# Patient Record
Sex: Male | Born: 1965 | Race: White | Hispanic: No | Marital: Married | Smoking: Never smoker
Health system: Southern US, Community
[De-identification: ages and names within clinical notes are randomized; demographics above are authoritative.]

## PROBLEM LIST (undated history)

## (undated) DIAGNOSIS — Z87442 Personal history of urinary calculi: Secondary | ICD-10-CM

## (undated) DIAGNOSIS — I499 Cardiac arrhythmia, unspecified: Secondary | ICD-10-CM

## (undated) DIAGNOSIS — I4819 Other persistent atrial fibrillation: Secondary | ICD-10-CM

## (undated) DIAGNOSIS — K8020 Calculus of gallbladder without cholecystitis without obstruction: Secondary | ICD-10-CM

## (undated) DIAGNOSIS — G4730 Sleep apnea, unspecified: Secondary | ICD-10-CM

## (undated) HISTORY — DX: Other persistent atrial fibrillation: I48.19

## (undated) HISTORY — PX: CYST EXCISION: SHX5701

---

## 1999-12-10 ENCOUNTER — Ambulatory Visit (HOSPITAL_COMMUNITY): Admission: RE | Admit: 1999-12-10 | Discharge: 1999-12-10 | Payer: Self-pay | Admitting: *Deleted

## 1999-12-10 ENCOUNTER — Encounter: Payer: Self-pay | Admitting: *Deleted

## 2019-05-24 ENCOUNTER — Other Ambulatory Visit: Payer: Self-pay | Admitting: Urology

## 2019-06-05 ENCOUNTER — Other Ambulatory Visit (HOSPITAL_COMMUNITY)
Admission: RE | Admit: 2019-06-05 | Discharge: 2019-06-05 | Disposition: A | Discharge status: Home / Self Care | Payer: 59 | Source: Ambulatory Visit | Attending: Urology | Admitting: Urology

## 2019-06-05 DIAGNOSIS — Z01812 Encounter for preprocedural laboratory examination: Secondary | ICD-10-CM | POA: Diagnosis not present

## 2019-06-05 DIAGNOSIS — Z20822 Contact with and (suspected) exposure to covid-19: Secondary | ICD-10-CM | POA: Insufficient documentation

## 2019-06-05 LAB — SARS CORONAVIRUS 2 (TAT 6-24 HRS): SARS Coronavirus 2: NEGATIVE

## 2019-06-06 ENCOUNTER — Encounter (HOSPITAL_BASED_OUTPATIENT_CLINIC_OR_DEPARTMENT_OTHER): Payer: Self-pay | Admitting: Urology

## 2019-06-08 ENCOUNTER — Other Ambulatory Visit: Payer: Self-pay

## 2019-06-08 ENCOUNTER — Encounter (HOSPITAL_BASED_OUTPATIENT_CLINIC_OR_DEPARTMENT_OTHER): Payer: Self-pay | Admitting: Anesthesiology

## 2019-06-08 ENCOUNTER — Observation Stay (HOSPITAL_BASED_OUTPATIENT_CLINIC_OR_DEPARTMENT_OTHER): Payer: 59

## 2019-06-08 ENCOUNTER — Encounter (HOSPITAL_COMMUNITY)
Admission: RE | Disposition: A | Discharge status: Home / Self Care | Payer: Self-pay | Source: Home / Self Care | Attending: Internal Medicine

## 2019-06-08 ENCOUNTER — Encounter (HOSPITAL_BASED_OUTPATIENT_CLINIC_OR_DEPARTMENT_OTHER): Payer: Self-pay | Admitting: Urology

## 2019-06-08 ENCOUNTER — Ambulatory Visit (HOSPITAL_COMMUNITY): Payer: 59

## 2019-06-08 ENCOUNTER — Observation Stay (HOSPITAL_BASED_OUTPATIENT_CLINIC_OR_DEPARTMENT_OTHER)
Admission: RE | Admit: 2019-06-08 | Discharge: 2019-06-09 | Disposition: A | Discharge status: Home / Self Care | Payer: 59 | Attending: Internal Medicine | Admitting: Internal Medicine

## 2019-06-08 ENCOUNTER — Telehealth: Payer: Self-pay

## 2019-06-08 DIAGNOSIS — Z87442 Personal history of urinary calculi: Secondary | ICD-10-CM | POA: Insufficient documentation

## 2019-06-08 DIAGNOSIS — G4730 Sleep apnea, unspecified: Secondary | ICD-10-CM | POA: Insufficient documentation

## 2019-06-08 DIAGNOSIS — I4891 Unspecified atrial fibrillation: Secondary | ICD-10-CM | POA: Diagnosis not present

## 2019-06-08 DIAGNOSIS — N200 Calculus of kidney: Secondary | ICD-10-CM | POA: Insufficient documentation

## 2019-06-08 HISTORY — PX: EXTRACORPOREAL SHOCK WAVE LITHOTRIPSY: SHX1557

## 2019-06-08 LAB — COMPREHENSIVE METABOLIC PANEL
ALT: 19 U/L (ref 0–44)
AST: 17 U/L (ref 15–41)
Albumin: 4.3 g/dL (ref 3.5–5.0)
Alkaline Phosphatase: 51 U/L (ref 38–126)
Anion gap: 7 (ref 5–15)
BUN: 17 mg/dL (ref 6–20)
CO2: 26 mmol/L (ref 22–32)
Calcium: 8.9 mg/dL (ref 8.9–10.3)
Chloride: 107 mmol/L (ref 98–111)
Creatinine, Ser: 1.02 mg/dL (ref 0.61–1.24)
GFR calc Af Amer: >60 mL/min (ref >60)
GFR calc non Af Amer: >60 mL/min (ref >60)
Glucose, Bld: 104 mg/dL — ABNORMAL HIGH (ref 70–99)
Potassium: 4.6 mmol/L (ref 3.5–5.1)
Sodium: 140 mmol/L (ref 135–145)
Total Bilirubin: 0.9 mg/dL (ref 0.3–1.2)
Total Protein: 7.1 g/dL (ref 6.5–8.1)

## 2019-06-08 LAB — LIPID PANEL
Cholesterol: 171 mg/dL (ref 0–200)
HDL: 39 mg/dL — ABNORMAL LOW (ref >40)
LDL Cholesterol: 119 mg/dL — ABNORMAL HIGH (ref 0–99)
Total CHOL/HDL Ratio: 4.4 RATIO
Triglycerides: 64 mg/dL (ref <150)
VLDL: 13 mg/dL (ref 0–40)

## 2019-06-08 LAB — CBC
HCT: 44.5 % (ref 39.0–52.0)
Hemoglobin: 14.5 g/dL (ref 13.0–17.0)
MCH: 28.8 pg (ref 26.0–34.0)
MCHC: 32.6 g/dL (ref 30.0–36.0)
MCV: 88.5 fL (ref 80.0–100.0)
Platelets: 237 10*3/uL (ref 150–400)
RBC: 5.03 MIL/uL (ref 4.22–5.81)
RDW: 12.7 % (ref 11.5–15.5)
WBC: 7.5 10*3/uL (ref 4.0–10.5)
nRBC: 0 % (ref 0.0–0.2)

## 2019-06-08 LAB — ECHOCARDIOGRAM COMPLETE
Height: 74 in
Weight: 3212.8 oz

## 2019-06-08 LAB — HEMOGLOBIN A1C
Hgb A1c MFr Bld: 5.6 % (ref 4.8–5.6)
Mean Plasma Glucose: 114.02 mg/dL

## 2019-06-08 LAB — PHOSPHORUS: Phosphorus: 3.7 mg/dL (ref 2.5–4.6)

## 2019-06-08 LAB — HIV ANTIBODY (ROUTINE TESTING W REFLEX): HIV Screen 4th Generation wRfx: NONREACTIVE

## 2019-06-08 LAB — PROTIME-INR
INR: 0.9 (ref 0.8–1.2)
Prothrombin Time: 12.5 seconds (ref 11.4–15.2)

## 2019-06-08 LAB — MAGNESIUM: Magnesium: 2.3 mg/dL (ref 1.7–2.4)

## 2019-06-08 LAB — TSH: TSH: 2.499 u[IU]/mL (ref 0.350–4.500)

## 2019-06-08 SURGERY — LITHOTRIPSY, ESWL
Anesthesia: LOCAL | Laterality: Left

## 2019-06-08 MED ORDER — DIPHENHYDRAMINE HCL 25 MG PO CAPS
ORAL_CAPSULE | ORAL | Status: AC
  Filled 2019-06-08: qty 1

## 2019-06-08 MED ORDER — MAGNESIUM HYDROXIDE 400 MG/5ML PO SUSP
30.0000 mL | Freq: Every day | ORAL | Status: DC | PRN
  Filled 2019-06-08: qty 30

## 2019-06-08 MED ORDER — CIPROFLOXACIN HCL 500 MG PO TABS
ORAL_TABLET | ORAL | Status: AC
  Filled 2019-06-08: qty 1

## 2019-06-08 MED ORDER — HYDROCODONE-ACETAMINOPHEN 5-325 MG PO TABS: 1.0000 | ORAL_TABLET | Freq: Four times a day (QID) | ORAL | 0 refills | Status: DC | PRN

## 2019-06-08 MED ORDER — DIAZEPAM 5 MG PO TABS
ORAL_TABLET | ORAL | Status: AC
  Filled 2019-06-08: qty 2

## 2019-06-08 MED ORDER — SODIUM CHLORIDE 0.9% FLUSH
3.0000 mL | Freq: Two times a day (BID) | INTRAVENOUS | Status: DC
  Filled 2019-06-08: qty 3

## 2019-06-08 MED ORDER — TAMSULOSIN HCL 0.4 MG PO CAPS: 0.4000 mg | ORAL_CAPSULE | Freq: Every day | ORAL | 0 refills | Status: AC

## 2019-06-08 MED ORDER — ACETAMINOPHEN 650 MG RE SUPP
650.0000 mg | Freq: Four times a day (QID) | RECTAL | Status: DC | PRN
  Filled 2019-06-08: qty 1

## 2019-06-08 MED ORDER — ACETAMINOPHEN 325 MG PO TABS
650.0000 mg | ORAL_TABLET | Freq: Four times a day (QID) | ORAL | Status: DC | PRN
  Filled 2019-06-08: qty 2

## 2019-06-08 MED ORDER — FLEET ENEMA 7-19 GM/118ML RE ENEM
1.0000 | ENEMA | Freq: Once | RECTAL | Status: DC | PRN
  Filled 2019-06-08: qty 1

## 2019-06-08 MED ORDER — METOPROLOL TARTRATE 5 MG/5ML IV SOLN
2.5000 mg | Freq: Once | INTRAVENOUS | Status: AC
  Administered 2019-06-08: 2.5 mg via INTRAVENOUS
  Filled 2019-06-08: qty 5

## 2019-06-08 MED ORDER — DIAZEPAM 5 MG PO TABS
10.0000 mg | ORAL_TABLET | ORAL | Status: AC
  Administered 2019-06-08: 10 mg via ORAL
  Filled 2019-06-08: qty 2

## 2019-06-08 MED ORDER — SENNA 8.6 MG PO TABS
1.0000 | ORAL_TABLET | Freq: Two times a day (BID) | ORAL | Status: DC
  Administered 2019-06-09: 8.6 mg via ORAL
  Filled 2019-06-08 (×3): qty 1

## 2019-06-08 MED ORDER — DIPHENHYDRAMINE HCL 25 MG PO CAPS
25.0000 mg | ORAL_CAPSULE | ORAL | Status: AC
  Administered 2019-06-08: 25 mg via ORAL
  Filled 2019-06-08: qty 1

## 2019-06-08 MED ORDER — ONDANSETRON HCL 4 MG/2ML IJ SOLN
4.0000 mg | Freq: Four times a day (QID) | INTRAMUSCULAR | Status: DC | PRN
  Filled 2019-06-08: qty 2

## 2019-06-08 MED ORDER — CIPROFLOXACIN HCL 500 MG PO TABS
500.0000 mg | ORAL_TABLET | ORAL | Status: AC
  Administered 2019-06-08: 07:00:00 500 mg via ORAL
  Filled 2019-06-08: qty 1

## 2019-06-08 MED ORDER — SODIUM CHLORIDE 0.9 % IV SOLN
INTRAVENOUS | Status: DC
  Filled 2019-06-08: qty 1000

## 2019-06-08 MED ORDER — ONDANSETRON HCL 4 MG PO TABS
4.0000 mg | ORAL_TABLET | Freq: Four times a day (QID) | ORAL | Status: DC | PRN
  Filled 2019-06-08: qty 1

## 2019-06-08 MED ORDER — METOPROLOL TARTRATE 5 MG/5ML IV SOLN
INTRAVENOUS | Status: AC
  Filled 2019-06-08: qty 5

## 2019-06-08 MED ORDER — SORBITOL 70 % SOLN
30.0000 mL | Freq: Every day | Status: DC | PRN
  Filled 2019-06-08: qty 30

## 2019-06-08 NOTE — Progress Notes (Signed)
Patient was tachycardic during ESWL ranging from 90s-120s.  Post-operative ECG showed A fib with RVR.  Patient is asymptomatic.  Medicine consult was called immediately following the procedure for evaluation and recommendation for above arrhythmia.

## 2019-06-08 NOTE — Discharge Instructions (Signed)
See [facility] discharge instructions in chart.  

## 2019-06-08 NOTE — Progress Notes (Signed)
  Echocardiogram 2D Echocardiogram has been performed.  Stark Bray Swaim 06/08/2019, 11:59 AM

## 2019-06-08 NOTE — H&P (Signed)
[facility] History and Physical  [name] YSA:[id number] DOB: [birth year] DOA: 06/08/2019 Referring physician: Dr. Arita Miss, Urology PCP: Patient, No Pcp Per  Chief Complaint: intra procedure A fib with RVR Came from: Home ------------------------------------------------------------------------------------------------------ Assessment/Plan: Active Problems:   Atrial fibrillation with RVR (HCC)  A. fib with RVR New onset A. Fib -Start during ESWL -Asymptomatic, heart rate as high as 140s.  Blood pressure stable. -No documented past medical history but seems to have undiagnosed sleep apnea. -Seems to have converted with 2.5 mg metoprolol IV 1 dose. Obtain post conversion EKG. -Obtain echocardiogram. -Patient has not had a physical in the last several years. I will obtain routine blood work including CBC, CMP, TSH, magnesium, lipid panel, A1c to look for any more risk factors.  Possible underlying sleep apnea -During my evaluation, I noted that he was dozing off periodically.  It could very well be due to the effect of anesthesia.  But, on my further questioning, his wife stated that she thinks he has sleep apnea.  He snores during sleep.  Sometimes she notices him not breathing and has to wake him up to breathe. Patient is not certain if he feels sleepy during the day more than normal.  He is certain he has never fallen asleep behind the wheels. -Needs referral to sleep center discharge for outpatient overnight sleep study.  Nephro/neurolithiasis -Underwent left ESWL today by Dr. Arita Miss. -Continue to follow up with her. -Noted that he has been started on Flomax and Norco as needed.  DVT prophylaxis:  SCDs.  Planned for short stay. Antimicrobials:  None Fluid: Normal saline at 50 mL/h Diet: Cardiac diet  Code Status:  Full code Mobility: Encourage ambulation Family Communication:  Wife at bedside Discharge plan:  Hopefully home  tomorrow  Consultants:  Urology  ----------------------------------------------------------------------------------------------------- History of Present Illness: [name] is a 54 y.o. male with no documented past medical history who underwent left ESWL by urologist Dr. Arita Miss at [facility] today.  Per the urologist note, during the procedure, patient was tachycardic between 90s to 120s.  Postoperatively, ECG was obtained which showed A. fib with RVR.  Patient did not have any symptoms. Medical consultation was called.  I attended the patient immediately at the outpatient surgical center recovery unit. Patient is a very pleasant middle-aged Caucasian male sitting up in recliner.  Not in distress.  His wife was on his side.  Telemetry monitoring shows heart rate running between 90s to 130s.  Patient does not complain of any chest pain, chest tightness, shortness of breath, palpitation, dizziness at this time. Patient states it has been several years since he got his last physical.  He does not recall being told of any medical issues.  Does not take any medications at home.  He never smoked, drinks alcohol occasionally, never does binge drinking, last alcohol intake was more than week ago.  Never did drugs.  No history of hypo/hyperthyroidism. Does not recall having an episode of unprovoked palpitation, chest pain, dizziness. Used to be physically active before the pandemic.  Less active lately because of back issue and works from home.  Has not felt any limitation in his exercise capacity when he has to. During my evaluation, I noted that he was dozing off periodically.  It could very well be due to the effect of anesthesia.  But, on my further questioning, his wife stated that she thinks he has sleep apnea.  He snores during sleep.  Sometimes she notices him not  breathing and has to wake him up to breathe. Patient is not certain if he feels sleepy during the day more than normal.   He is certain he has never fallen asleep behind the wheels.  Last set of vitals at the time of my evaluation, temperature normal, heart rate between 90s to 130s, pressure 122/87, breathing comfortably, on low-flow oxygen for comfort postop. Does not need oxygen at home. EKG obtained post procedure today showed A. fib with RVR at the rate of 133 bpm, no ST-T wave changes, QTC 482 ms. At my presence, will give him metoprolol 2.5 mg IV 1 dose after which his heart rate improved to 70s.  Telemetry continues to show occasional PVCs.  Review of Systems:  All systems were reviewed and were negative unless otherwise mentioned in the HPI  Past medical history: History reviewed. No pertinent past medical history.  Past surgical history: History reviewed. No pertinent surgical history.  Social History:  reports that he has never smoked. He has never used smokeless tobacco. He reports current alcohol use. He reports that he does not use drugs.  Allergies:  No Known Allergies  Family history:  History reviewed. No pertinent family history.   Home Meds: Prior to Admission medications   Medication Sig Start Date End Date Taking? Authorizing Provider  HYDROcodone-acetaminophen (NORCO/VICODIN) 5-325 MG tablet Take 1 tablet by mouth every 6 (six) hours as needed for moderate pain (following kidney stone surgery). 06/08/19 06/07/20  [name], MD  tamsulosin (FLOMAX) 0.4 MG CAPS capsule Take 1 capsule (0.4 mg total) by mouth daily. 06/08/19 07/08/19  [name], MD    Physical Exam: Vitals:   06/08/19 0945 06/08/19 1000 06/08/19 1015 06/08/19 1030  BP: (!) 127/111 (!) 141/94 122/87 107/76  Pulse: (!) 105 74 74 96  Resp: 15 18 13  (!) 23  Temp:      TempSrc:      SpO2: 100% 100% 100% 100%  Weight:      Height:       Wt Readings from Last 3 Encounters:  06/08/19 91.1 kg   Body mass index is 25.78 kg/m.  General exam: Appears calm and comfortable.  Skin: No rashes, lesions or  ulcers. HEENT: Atraumatic, normocephalic, supple neck, no obvious bleeding Lungs: Clear to auscultation bilaterally CVS: Irregular rhythm, slightly tachycardic, no murmur GI/Abd soft, nontender, nondistended, bowel sound present CNS: Alert, awake, oriented x3 Psychiatry: Mood appropriate Extremities: No pedal edema, no calf tenderness  Labs on Admission:   CBC: No results for input(s): WBC, NEUTROABS, HGB, HCT, MCV, PLT in the last 168 hours.  Basic Metabolic Panel: No results for input(s): NA, K, CL, CO2, GLUCOSE, BUN, CREATININE, CALCIUM, MG, PHOS in the last 168 hours.  Liver Function Tests: No results for input(s): AST, ALT, ALKPHOS, BILITOT, PROT, ALBUMIN in the last 168 hours. No results for input(s): LIPASE, AMYLASE in the last 168 hours. No results for input(s): AMMONIA in the last 168 hours.  Cardiac Enzymes: No results for input(s): CKTOTAL, CKMB, CKMBINDEX, TROPONINI in the last 168 hours.  BNP (last 3 results) No results for input(s): BNP in the last 8760 hours.  ProBNP (last 3 results) No results for input(s): PROBNP in the last 8760 hours.  CBG: No results for input(s): GLUCAP in the last 168 hours.  Lipase  No results found for: LIPASE   Urinalysis No results found for: COLORURINE, APPEARANCEUR, LABSPEC, PHURINE, GLUCOSEU, HGBUR, BILIRUBINUR, KETONESUR, PROTEINUR, UROBILINOGEN, NITRITE, LEUKOCYTESUR   Drugs of Abuse  No  results found for: LABOPIA, COCAINSCRNUR, LABBENZ, AMPHETMU, THCU, LABBARB    Radiological Exams on Admission: No results found. ----------------------------------------------------------------------------------------------------------------------------------------------------------- Severity of Illness: The appropriate patient status for this patient is OBSERVATION. Observation status is judged to be reasonable and necessary in order to provide the required intensity of service to ensure the patient's safety. The patient's presenting  symptoms, physical exam findings, and initial radiographic and laboratory data in the context of their medical condition is felt to place them at decreased risk for further clinical deterioration. Furthermore, it is anticipated that the patient will be medically stable for discharge from the hospital within 2 midnights of admission. The following factors support the patient status of observation.   " The patient's presenting symptoms include no symptoms " The physical exam findings include A. fib with RVR. " The initial radiographic and laboratory data are A. fib with RVR.   Signed, Lorin Glass, MD [facility] 06/08/2019

## 2019-06-08 NOTE — Op Note (Signed)
See [facility] OP note scanned into chart. Also because of the size, density, location and other factors that cannot be anticipated I feel this will likely be a staged procedure. This fact supersedes any indication in the scanned [facility] operative note to the contrary.  

## 2019-06-08 NOTE — H&P (Signed)
CC: I have pain in the flank.  HPI: [name] is a 54 year-old male patient who was referred by Dr. Alanson Aly. Lenise Arena [facility]), MD who is here for flank pain.  The problem is on the right side. His pain started about 01/14/2019. The pain is dull. The intensity of his pain is rated as a 5. The pain is intermittent. The pain does radiate.   Noted right flank pain and dark urine Oct - Dec 2020. Pain down into LLQ. KUB 04/12/2019 with possible RLP stone (2-3 mm) and left proximal stone (6-76mm). He hasn't seen a stone pass. He has MH on UA with 3-10 rbc. AUASS = 8. Low back pain but sitting at home a lot. Drank more water. Non smoker and no exposures.   CT today with 3-4 mm RLP stones and an 8 mm LLP stone. Visible on scout.   Spermatocele in 2016 noted in [place]. He recalls passing a small stone years ago.     ALLERGIES: No Allergies    MEDICATIONS: Prednisolone Acetate 1 % suspension, drops  Vitamin D3  Zinc     GU PSH: Vasectomy - 2016, 2007       PSH Notes: Surgery Of Male Genitalia Vasectomy, Excision Of Lesion Legs   NON-GU PSH: None   GU PMH: Spermatocele of epididymis, Unspec, Spermatocele - 2016 History of urolithiasis, History of kidney stones - 2016    NON-GU PMH: Encounter for general adult medical examination without abnormal findings, Encounter for preventive health examination - 2016    FAMILY HISTORY: 2 daughters - Other 1 son - Other Bladder Cancer - Grandmother gallbladder disease - Runs In Family Kidney Stones - Runs In Family Patient's father is still living - Other patient's mother is still living - Other   SOCIAL HISTORY: Marital Status: Married Current Smoking Status: Patient has never smoked.   Tobacco Use Assessment Completed: Used Tobacco in last 30 days? Social Drinker.  Does not drink caffeine. Patient's occupation Product/process development scientist.     Notes: Never a smoker, Alcohol use, Occupation, Caffeine use, Married, Two children   REVIEW OF SYSTEMS:     GU Review Male:   Patient reports frequent urination, get up at night to urinate, and have to strain to urinate . Patient denies hard to postpone urination, burning/ pain with urination, leakage of urine, stream starts and stops, trouble starting your stream, erection problems, and penile pain.  Gastrointestinal (Upper):   Patient denies nausea, vomiting, and indigestion/ heartburn.  Gastrointestinal (Lower):   Patient denies diarrhea and constipation.  Constitutional:   Patient denies fever, night sweats, weight loss, and fatigue.  Skin:   Patient denies skin rash/ lesion and itching.  Eyes:   Patient denies blurred vision and double vision.  Ears/ Nose/ Throat:   Patient denies sore throat and sinus problems.  Hematologic/Lymphatic:   Patient denies swollen glands and easy bruising.  Cardiovascular:   Patient denies leg swelling and chest pains.  Respiratory:   Patient denies cough and shortness of breath.  Endocrine:   Patient denies excessive thirst.  Musculoskeletal:   Patient denies joint pain and back pain.  Neurological:   Patient denies headaches and dizziness.  Psychologic:   Patient denies depression and anxiety.   Notes: Blood in urine    VITAL SIGNS:      05/17/2019 08:39 AM  Weight 202 lb / 91.63 kg  Height 74 in / 187.96 cm  BP 139/94 mmHg  Pulse 65 /min  Temperature 97.8 F /  36.5 C  BMI 25.9 kg/m   GU PHYSICAL EXAMINATION:    Anus and Perineum: No hemorrhoids. No anal stenosis. No rectal fissure, no anal fissure. No edema, no dimple, no perineal tenderness, no anal tenderness.  Scrotum: No lesions. No edema. No cysts. No warts.  Epididymides: Right: no spermatocele, no masses, no cysts, no tenderness, no induration, no enlargement. Left: no spermatocele, no masses, no cysts, no tenderness, no induration, no enlargement.  Testes: No tenderness, no swelling, no enlargement left testes. No tenderness, no swelling, no enlargement right testes. Normal location left  testes. Normal location right testes. No mass, no cyst, no varicocele, no hydrocele left testes. No mass, no cyst, no varicocele, no hydrocele right testes.  Urethral Meatus: Normal size. No lesion, no wart, no discharge, no polyp. Normal location.  Penis: Circumcised, no warts, no cracks. No dorsal Peyronie's plaques, no left corporal Peyronie's plaques, no right corporal Peyronie's plaques, no scarring, no warts. No balanitis, no meatal stenosis.  Prostate: Prostate about 30 grams. Left lobe normal consistency, right lobe normal consistency. Symmetrical lobes. No prostate nodule. Left lobe no tenderness, right lobe no tenderness.   Seminal Vesicles: Nonpalpable.  Sphincter Tone: Normal sphincter. No rectal tenderness. No rectal mass.    MULTI-SYSTEM PHYSICAL EXAMINATION:    Constitutional: Well-nourished. No physical deformities. Normally developed. Good grooming.  Neck: Neck symmetrical, not swollen. Normal tracheal position.  Respiratory: No labored breathing, no use of accessory muscles.   Cardiovascular: Normal temperature, normal extremity pulses, no swelling, no varicosities.  Lymphatic: No enlargement of neck, axillae, groin.  Skin: No paleness, no jaundice, no cyanosis. No lesion, no ulcer, no rash.  Neurologic / Psychiatric: Oriented to time, oriented to place, oriented to person. No depression, no anxiety, no agitation.  Gastrointestinal: No mass, no tenderness, no rigidity, non obese abdomen.  Eyes: Normal conjunctivae. Normal eyelids.  Ears, Nose, Mouth, and Throat: Left ear no scars, no lesions, no masses. Right ear no scars, no lesions, no masses. Nose no scars, no lesions, no masses. Normal hearing. Normal lips.  Musculoskeletal: Normal gait and station of head and neck.     PAST DATA REVIEWED:  Source Of History:  Patient  X-Ray Review: KUB: Reviewed Films. 03/2019    PROCEDURES:         C.T. ABD-Pelv w/o - 16109      Patient confirmed No Neulasta OnPro Device.            Urinalysis w/Scope Dipstick Dipstick Cont'd Micro  Color: Yellow Bilirubin: Neg mg/dL WBC/hpf: 0 - 5/hpf  Appearance: Clear Ketones: Neg mg/dL RBC/hpf: 3 - 10/hpf  Specific Gravity: 1.025 Blood: 1+ ery/uL Bacteria: Rare (0-9/hpf)  pH: 5.5 Protein: Neg mg/dL Cystals: NS (Not Seen)  Glucose: Neg mg/dL Urobilinogen: 0.2 mg/dL Casts: NS (Not Seen)    Nitrites: Neg Trichomonas: Not Present    Leukocyte Esterase: Trace leu/uL Mucous: Not Present      Epithelial Cells: NS (Not Seen)      Yeast: NS (Not Seen)      Sperm: Not Present    ASSESSMENT:      ICD-10 Details  1 GU:   Flank Pain - U04.54 Acute, Uncomplicated  2   Gross hematuria - U98.1 Acute, Uncomplicated - Will need to recheck UA and consider cystoscopy.   3   Renal calculus - X91.4 Acute, Uncomplicated - I discussed with the patient the nature risks and benefits of continued stone surveillance, off label use of alpha blockers, shockwave lithotripsy or ureteroscopy. All  questions answered. I drew him a picture of the anatomy. Discussed a colleague would do LEFT ESWL on the LLP stone. Observe the right.

## 2019-06-08 NOTE — Progress Notes (Addendum)
Pt was seen on rounds earlier today while still in recovery at [facility]. He was well with rate in 70-90s. We discussed the afib and the ESWL treatment. We discussed the sleep apnea and some of the movement during the procedure which might have diminished efficacy. We will see how the KUB looks in a few weeks. Appreciate excellent care from Dr. Arita Miss and Dr. Pola Corn. I will follow.

## 2019-06-09 DIAGNOSIS — I4891 Unspecified atrial fibrillation: Secondary | ICD-10-CM | POA: Diagnosis not present

## 2019-06-09 LAB — BASIC METABOLIC PANEL
Anion gap: 8 (ref 5–15)
BUN: 19 mg/dL (ref 6–20)
CO2: 27 mmol/L (ref 22–32)
Calcium: 9.1 mg/dL (ref 8.9–10.3)
Chloride: 107 mmol/L (ref 98–111)
Creatinine, Ser: 1.14 mg/dL (ref 0.61–1.24)
GFR calc Af Amer: >60 mL/min (ref >60)
GFR calc non Af Amer: >60 mL/min (ref >60)
Glucose, Bld: 100 mg/dL — ABNORMAL HIGH (ref 70–99)
Potassium: 4.4 mmol/L (ref 3.5–5.1)
Sodium: 142 mmol/L (ref 135–145)

## 2019-06-09 LAB — CBC
HCT: 45.5 % (ref 39.0–52.0)
Hemoglobin: 14.6 g/dL (ref 13.0–17.0)
MCH: 28.6 pg (ref 26.0–34.0)
MCHC: 32.1 g/dL (ref 30.0–36.0)
MCV: 89.2 fL (ref 80.0–100.0)
Platelets: 217 10*3/uL (ref 150–400)
RBC: 5.1 MIL/uL (ref 4.22–5.81)
RDW: 12.8 % (ref 11.5–15.5)
WBC: 10.2 10*3/uL (ref 4.0–10.5)
nRBC: 0 % (ref 0.0–0.2)

## 2019-06-09 MED ORDER — METOPROLOL SUCCINATE ER 25 MG PO TB24
25.0000 mg | ORAL_TABLET | Freq: Every day | ORAL | Status: DC
  Administered 2019-06-09: 11:00:00 25 mg via ORAL
  Filled 2019-06-09: qty 1

## 2019-06-09 MED ORDER — METOPROLOL SUCCINATE ER 25 MG PO TB24: 12.5000 mg | ORAL_TABLET | Freq: Every day | ORAL | 0 refills | Status: DC

## 2019-06-09 NOTE — Progress Notes (Signed)
Urology Inpatient Progress Report  Atrial fibrillation with RVR (HCC) [I48.91]  Procedure(s): EXTRACORPOREAL SHOCK WAVE LITHOTRIPSY (ESWL)  1 Day Post-Op   Intv/Subj: Patient developed A fib with RVR during ESWL yesterday.  Medicine consult was called and patient admitted to [facility] for echo, tele, and labwork.  He feels well this AM.  No back pain or urinary issues.  He denies palpitations.   Active Problems:   Atrial fibrillation with RVR (HCC)  Current Facility-Administered Medications  Medication Dose Route Frequency Provider Last Rate Last Admin  . 0.9 %  sodium chloride infusion   Intravenous Continuous Lorin Glass, MD 50 mL/hr at 06/09/19 0549 New Bag at 06/09/19 0549  . acetaminophen (TYLENOL) tablet 650 mg  650 mg Oral Q6H PRN Dahal, Melina Schools, MD       Or  . acetaminophen (TYLENOL) suppository 650 mg  650 mg Rectal Q6H PRN Dahal, Binaya, MD      . magnesium hydroxide (MILK OF MAGNESIA) suspension 30 mL  30 mL Oral Daily PRN Dahal, Binaya, MD      . ondansetron (ZOFRAN) tablet 4 mg  4 mg Oral Q6H PRN Dahal, Binaya, MD       Or  . ondansetron (ZOFRAN) injection 4 mg  4 mg Intravenous Q6H PRN Dahal, Binaya, MD      . senna (SENOKOT) tablet 8.6 mg  1 tablet Oral BID Dahal, Binaya, MD      . sodium chloride flush (NS) 0.9 % injection 3 mL  3 mL Intravenous Q12H Dahal, Binaya, MD      . sodium phosphate (FLEET) 7-19 GM/118ML enema 1 enema  1 enema Rectal Once PRN Dahal, Binaya, MD      . sorbitol 70 % solution 30 mL  30 mL Oral Daily PRN Lorin Glass, MD         Objective: Vital: Vitals:   06/08/19 1230 06/08/19 1335 06/08/19 2047 06/09/19 0448  BP: 112/80 (!) 144/91 120/79 (!) 128/99  Pulse: 90 76 72 79  Resp: 18 16  17   Temp:  97.6 F (36.4 C) 98.6 F (37 C) 98.1 F (36.7 C)  TempSrc:  Oral Oral Oral  SpO2: 100% 99% 100% 100%  Weight:      Height:       I/Os: I/O last 3 completed shifts: In: 2291.8 [P.O.:480; I.V.:1811.8] Out: 600 [Urine:600]  Physical Exam:   General: Patient is in no apparent distress Lungs: Normal respiratory effort, chest expands symmetrically. GI: The abdomen is soft and nontender without mass. Ext: lower extremities symmetric  Lab Results: Recent Labs    06/08/19 1113 06/09/19 0407  WBC 7.5 10.2  HGB 14.5 14.6  HCT 44.5 45.5   Recent Labs    06/08/19 1113 06/09/19 0407  NA 140 142  K 4.6 4.4  CL 107 107  CO2 26 27  GLUCOSE 104* 100*  BUN 17 19  CREATININE 1.02 1.14  CALCIUM 8.9 9.1   Recent Labs    06/08/19 1113  INR 0.9   No results for input(s): LABURIN in the last 72 hours. Results for orders placed or performed during the hospital encounter of 06/05/19  SARS CORONAVIRUS 2 (TAT 6-24 HRS) Nasopharyngeal Nasopharyngeal Swab     Status: None   Collection Time: 06/05/19  8:59 AM   Specimen: Nasopharyngeal Swab  Result Value Ref Range Status   SARS Coronavirus 2 NEGATIVE NEGATIVE Final    Comment: (NOTE) SARS-CoV-2 target nucleic acids are NOT DETECTED. The SARS-CoV-2 RNA is generally detectable in upper  and lower respiratory specimens during the acute phase of infection. Negative results do not preclude SARS-CoV-2 infection, do not rule out co-infections with other pathogens, and should not be used as the sole basis for treatment or other patient management decisions. Negative results must be combined with clinical observations, patient history, and epidemiological information. The expected result is Negative. Fact Sheet for Patients: HairSlick.no Fact Sheet for Healthcare Providers: quierodirigir.com This test is not yet approved or cleared by the [place] FDA and  has been authorized for detection and/or diagnosis of SARS-CoV-2 by FDA under an Emergency Use Authorization (EUA). This EUA will remain  in effect (meaning this test can be used) for the duration of the COVID-19 declaration under Section 56 4(b)(1) of the Act, 21  U.S.C. section 360bbb-3(b)(1), unless the authorization is terminated or revoked sooner. Performed at [facility], [address], [place], [place] [address]     Studies/Results: DG Abd 1 View  Result Date: 06/08/2019 CLINICAL DATA:  Preop lithotripsy. EXAM: ABDOMEN - 1 VIEW COMPARISON:  05/17/2019 FINDINGS: The bowel gas pattern is normal. Bilateral kidney stones are again identified. These are unchanged in appearance when compared with the CT from 05/17/2019. The largest stone is in the inferior pole of the left kidney measuring 8 mm. IMPRESSION: Bilateral renal calculi. Electronically Signed   By: Signa Kell M.D.   On: 06/08/2019 11:05   ECHOCARDIOGRAM COMPLETE  Result Date: 06/08/2019    ECHOCARDIOGRAM REPORT   Patient Name:   David TEBBETTS Date of Exam: 06/08/2019 Medical Rec #:  [id number]       Height:       74.0 in Accession #:    [id number]      Weight:       200.8 lb Date of Birth:  [birth year]       BSA:          2.177 m Patient Age:    54 years        BP:           107/76 mmHg Patient Gender: M               HR:           78 bpm. Exam Location:  Inpatient Procedure: 2D Echo, Cardiac Doppler and Color Doppler Indications:    Atrial Fibrillation 427.31  History:        Patient has no prior history of Echocardiogram examinations.                 Arrythmias:Atrial Fibrillation; Risk Factors:Non-Smoker.  Sonographer:    Renella Cunas RDCS Referring Phys: [id number] [facility] IMPRESSIONS  1. Left ventricular ejection fraction, by estimation, is 60 to 65%. The left ventricle has normal function. The left ventricle has no regional wall motion abnormalities. LV diastolic filing cannot be assessed due to underlying atrial fibrillation.  2. Right ventricular systolic function is normal. The right ventricular size is normal. Tricuspid regurgitation signal is inadequate for assessing PA pressure.  3. The mitral valve is normal in structure and function. No evidence of mitral valve  regurgitation. No evidence of mitral stenosis.  4. The aortic valve is tricuspid. Aortic valve regurgitation is not visualized. Mild aortic valve sclerosis is present, with no evidence of aortic valve stenosis.  5. The inferior vena cava is dilated in size with >50% respiratory variability, suggesting right atrial pressure of 8 mmHg. FINDINGS  Left Ventricle: Left ventricular ejection fraction, by estimation, is 60 to 65%.  The left ventricle has normal function. The left ventricle has no regional wall motion abnormalities. The left ventricular internal cavity size was normal in size. There is  no left ventricular hypertrophy. LV diastolic filing cannot be assessed due to underlying atrial fibrillation. Right Ventricle: The right ventricular size is normal. No increase in right ventricular wall thickness. Right ventricular systolic function is normal. Tricuspid regurgitation signal is inadequate for assessing PA pressure. Left Atrium: Left atrial size was normal in size. Right Atrium: Right atrial size was normal in size. Pericardium: There is no evidence of pericardial effusion. Mitral Valve: The mitral valve is normal in structure and function. Normal mobility of the mitral valve leaflets. No evidence of mitral valve regurgitation. No evidence of mitral valve stenosis. Tricuspid Valve: The tricuspid valve is normal in structure. Tricuspid valve regurgitation is trivial. No evidence of tricuspid stenosis. Aortic Valve: The aortic valve is tricuspid. Aortic valve regurgitation is not visualized. Mild aortic valve sclerosis is present, with no evidence of aortic valve stenosis. Pulmonic Valve: The pulmonic valve was normal in structure. Pulmonic valve regurgitation is not visualized. No evidence of pulmonic stenosis. Aorta: The aortic root is normal in size and structure. Venous: The inferior vena cava is dilated in size with greater than 50% respiratory variability, suggesting right atrial pressure of 8 mmHg.  IAS/Shunts: The interatrial septum appears to be lipomatous. No atrial level shunt detected by color flow Doppler.  LEFT VENTRICLE PLAX 2D LVIDd:         4.90 cm LVIDs:         4.00 cm LV PW:         0.80 cm LV IVS:        0.80 cm LVOT diam:     2.20 cm LV SV:         55 LV SV Index:   25 LVOT Area:     3.80 cm  RIGHT VENTRICLE TAPSE (M-mode): 1.8 cm LEFT ATRIUM             Index       RIGHT ATRIUM           Index LA diam:        3.50 cm 1.61 cm/m  RA Area:     16.10 cm LA Vol (A2C):   51.7 ml 23.75 ml/m RA Volume:   40.10 ml  18.42 ml/m LA Vol (A4C):   33.1 ml 15.20 ml/m LA Biplane Vol: 43.5 ml 19.98 ml/m  AORTIC VALVE LVOT Vmax:   80.70 cm/s LVOT Vmean:  57.500 cm/s LVOT VTI:    0.145 m  AORTA Ao Root diam: 3.50 cm  SHUNTS Systemic VTI:  0.14 m Systemic Diam: 2.20 cm Fransico Him MD Electronically signed by Fransico Him MD Signature Date/Time: 06/08/2019/12:18:46 PM    Final     Assessment: Procedure(s): EXTRACORPOREAL SHOCK WAVE LITHOTRIPSY (ESWL), 1 Day Post-Op  doing well.  Plan: -pt to continue to drink fluids and PRN pain control for ESWL -appreciate care from internal medicine regarding A fib   Jacalyn Lefevre, MD Urology 06/09/2019, 7:58 AM

## 2019-06-09 NOTE — Discharge Summary (Signed)
Physician Discharge Summary  [name] XBM:[id number] DOB: [birth year] DOA: 06/08/2019  PCP: Patient, No Pcp Per  Admit date: 06/08/2019 Discharge date: 06/09/2019  Admitted From: Home Discharge disposition: Home   Code Status: Full Code  Diet Recommendation: Heart healthy diet   Recommendations for Outpatient Follow-Up:   1. Follow-up with A. fib clinic as an outpatient 2. Follow-up with sleep apnea clinic as an outpatient 3. Follow-up with urology as outpatient  Discharge Diagnosis:   Active Problems:   Atrial fibrillation with RVR (HCC)  History of Present Illness / Brief narrative:  [name] is a 54 y.o. male with no documented past medical history who underwent left ESWL by urologist Dr. Arita Miss at [facility] on 2/25.  Per the urologist note, during the procedure, patient was tachycardic between 90s to 120s.  Postoperatively, ECG was obtained which showed A. fib with RVR.  Patient did not have any symptoms. Medical consultation was called.  I attended the patient immediately at the outpatient surgical center recovery unit.  Patient is a very pleasant middle-aged Caucasian male sitting up in recliner.  Not in distress.  His wife was on his side.  Telemetry monitoring showed heart rate running between 90s to 130s.  Patient had no complaint of any chest pain, chest tightness, shortness of breath, palpitation, dizziness at that time. Patient states it has been several years since he got his last physical.  He does not recall being told of any medical issues. Does not take any medications at home.  He never smoked, drinks alcohol occasionally, never does binge drinking, last alcohol intake was more than week ago.  Never did drugs.  No history of hypo/hyperthyroidism. Does not recall having an episode of unprovoked palpitation, chest pain, dizziness. Used to be physically active before the pandemic.  Less active lately because of back issue and works from home.   Has not felt any limitation in his exercise capacity when he has to.  During my evaluation, I noted that he was dozing off periodically.  It could very well be due to the effect of anesthesia.  But, on my further questioning, his wife stated that she thinks he has sleep apnea.  He snores during sleep.  Sometimes she notices him not breathing and has to wake him up to breathe. Patient is not certain if he feels sleepy during the day more than normal.  He is certain he has never fallen asleep behind the wheels.  Vitals at the time of my evaluation, temperature normal, heart rate between 90s to 130s, pressure 122/87, breathing comfortably, on low-flow oxygen for comfort postop. Does not need oxygen at home. EKG obtained post procedure today showed A. fib with RVR at the rate of 133 bpm, no ST-T wave changes, QTC 482 ms. At my presence, I gave him metoprolol 2.5 mg IV 1 dose after which his heart rate improved to 70s and sustained but rhythm remained in A. Fib. At that time, decision was made to keep him in the hospital overnight for observation.  Hospital Course:  A. fib with RVR New onset A. Fib -Patient was noted to be in RVR during the procedure of ESWL.  His heart rate went as high as 150s with no symptoms and stable blood pressure. -1 dose of metoprolol IV 2.5 mg helped heart rate improve to 70s but rhythm remained in A. Fib.  Because his heart rate also dipped down to 50s yesterday, I did not put him on any  scheduled AV nodal agent. -He does not have any documented past medical history and is not on any medicines at home.   -Based on history, patient most likely has obstructive sleep apnea which could be the driver of A. fib.   -In last 24 hours of telemetry monitoring, it has been noted that patient's heart rate remained mostly stable in the 70s but at times up to 123 and down to 40s.  Last night, he also had pause up to 1.5 seconds, again asymptomatic, probably related to sleep apnea  episodes. -Echocardiogram with EF 60 to 65%, no structural abnormality. -Labs with A1c 5.6, TSH normal, LDL at the elevated to 119 -I discussed the case with cardiologist on-call Dr. Rennis Golden for curbside consult.  He recommended to start on low-dose beta-blocker and discharged him to follow-up at A. fib clinic. -I have started him on metoprolol succinate 12.5 mg daily this morning.  Referral put in for A. fib clinic appointment.  Possible underlying sleep apnea -During my evaluation, I noted that he was dozing off periodically.  It could very well be due to the effect of anesthesia.  But, on my further questioning, his wife stated that she thinks he has sleep apnea.  He snores during sleep.  Sometimes she notices him not breathing and has to wake him up to breathe. Patient is not certain if he feels sleepy during the day more than normal.  He is certain he has never fallen asleep behind the wheels. -Referral given to sleep center discharge for outpatient overnight sleep study.  Nephro/neurolithiasis -Underwent left ESWL 2/25 by Dr. Arita Miss. -Continue to follow up with her. -Noted that he has been started on Flomax and Norco as needed.  Stable for discharge to home today.   Subjective:  Seen and examined this morning.  Pleasant middle-aged Caucasian male.  Not in distress.  No symptoms.  Has not essentially felt any different than yesterday.  Discharge Exam:   Vitals:   06/08/19 1335 06/08/19 2047 06/09/19 0448 06/09/19 1054  BP: (!) 144/91 120/79 (!) 128/99 122/79  Pulse: 76 72 79 (!) 117  Resp: 16  17   Temp: 97.6 F (36.4 C) 98.6 F (37 C) 98.1 F (36.7 C)   TempSrc: Oral Oral Oral   SpO2: 99% 100% 100%   Weight:      Height:        Body mass index is 25.78 kg/m.  General exam: Appears calm and comfortable.  Skin: No rashes, lesions or ulcers. HEENT: Atraumatic, normocephalic, supple neck, no obvious bleeding Lungs: Clear to auscultation bilaterally CVS: Controlled rate, A.  fib, no murmur GI/Abd soft, nontender, nondistended bowel sound present CNS: Alert, awake, oriented x3 Psychiatry: Mood appropriate Extremities: No pedal edema, no calf tenderness  Discharge Instructions:  Wound care: None Discharge Instructions    Amb Referral to AFIB Clinic   Complete by: As directed    Ambulatory referral to Sleep Studies   Complete by: As directed    Diet - low sodium heart healthy   Complete by: As directed    Diet - low sodium heart healthy   Complete by: As directed    Electrocardiogram report   Complete by: As directed    Increase activity slowly   Complete by: As directed    Increase activity slowly   Complete by: As directed      Follow-up Information    [facility] On 06/22/2019.   Why: 8:00am Contact information: [address] [place] [address] [phone]       [facility] Follow up.   Specialty: Sleep Medicine Contact information: [address] [place] [id number] mc [place] [address] [phone]       [facility] Follow up.   Specialty: Cardiology Contact information: [address] [id number] [address] [place] [address] [phone]         Allergies as of 06/09/2019   No Known Allergies     Medication List    TAKE these medications   HYDROcodone-acetaminophen 5-325 MG tablet Commonly known as: NORCO/VICODIN Take 1 tablet by mouth every 6 (six) hours as needed for moderate pain (following kidney stone surgery).   metoprolol succinate 25 MG 24 hr tablet Commonly known as: TOPROL-XL Take 0.5 tablets (12.5 mg total) by mouth daily. Start taking on: June 10, 2019   tamsulosin 0.4 MG Caps capsule Commonly known as: FLOMAX Take 1 capsule (0.4 mg total) by mouth daily.       Time coordinating discharge: 35 minutes  The results of significant diagnostics from this hospitalization (including  imaging, microbiology, ancillary and laboratory) are listed below for reference.    Procedures and Diagnostic Studies:   No results found.   Labs:   Basic Metabolic Panel: Recent Labs  Lab 06/08/19 1113 06/09/19 0407  NA 140 142  K 4.6 4.4  CL 107 107  CO2 26 27  GLUCOSE 104* 100*  BUN 17 19  CREATININE 1.02 1.14  CALCIUM 8.9 9.1  MG 2.3  --   PHOS 3.7  --    GFR Estimated Creatinine Clearance: 88.1 mL/min (by C-G formula based on SCr of 1.14 mg/dL). Liver Function Tests: Recent Labs  Lab 06/08/19 1113  AST 17  ALT 19  ALKPHOS 51  BILITOT 0.9  PROT 7.1  ALBUMIN 4.3   No results for input(s): LIPASE, AMYLASE in the last 168 hours. No results for input(s): AMMONIA in the last 168 hours. Coagulation profile Recent Labs  Lab 06/08/19 1113  INR 0.9    CBC: Recent Labs  Lab 06/08/19 1113 06/09/19 0407  WBC 7.5 10.2  HGB 14.5 14.6  HCT 44.5 45.5  MCV 88.5 89.2  PLT 237 217   Cardiac Enzymes: No results for input(s): CKTOTAL, CKMB, CKMBINDEX, TROPONINI in the last 168 hours. BNP: Invalid input(s): POCBNP CBG: No results for input(s): GLUCAP in the last 168 hours. D-Dimer No results for input(s): DDIMER in the last 72 hours. Hgb A1c Recent Labs    06/08/19 1113  HGBA1C 5.6   Lipid Profile Recent Labs    06/08/19 1113  CHOL 171  HDL 39*  LDLCALC 119*  TRIG 64  CHOLHDL 4.4   Thyroid function studies Recent Labs    06/08/19 1113  TSH 2.499   Anemia work up No results for input(s): VITAMINB12, FOLATE, FERRITIN, TIBC, IRON, RETICCTPCT in the last 72 hours. Microbiology Recent Results (from the past 240 hour(s))  SARS CORONAVIRUS 2 (TAT 6-24 HRS) Nasopharyngeal Nasopharyngeal Swab     Status: None   Collection Time: 06/05/19  8:59 AM   Specimen: Nasopharyngeal Swab  Result Value Ref Range Status   SARS Coronavirus 2 NEGATIVE NEGATIVE Final    Comment: (NOTE) SARS-CoV-2 target nucleic acids are NOT DETECTED. The SARS-CoV-2 RNA is  generally detectable in upper and lower respiratory specimens during the acute phase of infection. Negative results do not preclude SARS-CoV-2 infection, do not rule out co-infections with other pathogens, and  should not be used as the sole basis for treatment or other patient management decisions. Negative results must be combined with clinical observations, patient history, and epidemiological information. The expected result is Negative. Fact Sheet for Patients: SugarRoll.be Fact Sheet for Healthcare Providers: https://www.woods-mathews.com/ This test is not yet approved or cleared by the [place] FDA and  has been authorized for detection and/or diagnosis of SARS-CoV-2 by FDA under an Emergency Use Authorization (EUA). This EUA will remain  in effect (meaning this test can be used) for the duration of the COVID-19 declaration under Section 56 4(b)(1) of the Act, 21 U.S.C. section 360bbb-3(b)(1), unless the authorization is terminated or revoked sooner. Performed at [facility], [place] [address], [place], [place] [address]     Please note: You were cared for by a hospitalist during your hospital stay. Once you are discharged, your primary care physician will handle any further medical issues. Please note that NO REFILLS for any discharge medications will be authorized once you are discharged, as it is imperative that you return to your primary care physician (or establish a relationship with a primary care physician if you do not have one) for your post hospital discharge needs so that they can reassess your need for medications and monitor your lab values.  Signed: Terrilee Croak  [facility] 06/09/2019, 11:13 AM

## 2019-06-13 ENCOUNTER — Ambulatory Visit (HOSPITAL_COMMUNITY)
Admission: RE | Admit: 2019-06-13 | Discharge: 2019-06-13 | Disposition: A | Discharge status: Home / Self Care | Payer: 59 | Source: Ambulatory Visit | Attending: Physician Assistant | Admitting: Physician Assistant

## 2019-06-13 ENCOUNTER — Other Ambulatory Visit: Payer: Self-pay

## 2019-06-13 ENCOUNTER — Encounter (HOSPITAL_COMMUNITY): Payer: Self-pay | Admitting: Physician Assistant

## 2019-06-13 VITALS — BP 142/82 | HR 86 | Ht 74.0 in | Wt 201.8 lb

## 2019-06-13 DIAGNOSIS — Z87442 Personal history of urinary calculi: Secondary | ICD-10-CM | POA: Insufficient documentation

## 2019-06-13 DIAGNOSIS — I4819 Other persistent atrial fibrillation: Secondary | ICD-10-CM | POA: Insufficient documentation

## 2019-06-13 DIAGNOSIS — Z79899 Other long term (current) drug therapy: Secondary | ICD-10-CM | POA: Insufficient documentation

## 2019-06-13 DIAGNOSIS — R0683 Snoring: Secondary | ICD-10-CM | POA: Diagnosis not present

## 2019-06-13 DIAGNOSIS — I4891 Unspecified atrial fibrillation: Secondary | ICD-10-CM

## 2019-06-13 MED ORDER — RIVAROXABAN 20 MG PO TABS: 20.0000 mg | ORAL_TABLET | Freq: Every day | ORAL | 3 refills | Status: DC

## 2019-06-13 NOTE — Patient Instructions (Signed)
Start Xarelto 20mg  once a day WITH SUPPER  Scheduling will be in touch regarding sleep study

## 2019-06-13 NOTE — Progress Notes (Signed)
Primary Care Physician: Patient, No Pcp Per Primary Cardiologist: none Primary Electrophysiologist: none Referring Physician: Dr Collins Scotland is a 54 y.o. male with a history of nephrolithiasis and new onset atrial fibrillation who presents for consultation in the [facility].  The patient was initially diagnosed with atrial fibrillation 06/08/19 after presenting for a ESWL for kidney stones. Afib with RVR noted incidentally and he was asymptomatic. Patient is not on anticoagulation with a CHADS2VASC score of 0. He was started on metoprolol and discharged in rate controlled afib. Patient reports that he may feel a "flutter" occasionally but he denies any other symptoms. He does admit to severe snoring. He denies significant alcohol use.   Today, he denies symptoms of chest pain, shortness of breath, orthopnea, PND, lower extremity edema, dizziness, presyncope, syncope, snoring, daytime somnolence, bleeding, or neurologic sequela. The patient is tolerating medications without difficulties and is otherwise without complaint today.    Atrial Fibrillation Risk Factors:  he does have symptoms or diagnosis of sleep apnea. Sleep study pending. he does not have a history of rheumatic fever. he does not have a history of alcohol use. The patient does not have a history of early familial atrial fibrillation or other arrhythmias.  he has a BMI of Body mass index is 25.91 kg/m.Marland Kitchen Filed Weights   06/13/19 0841  Weight: 91.5 kg    No family history on file.   Atrial Fibrillation Management history:  Previous antiarrhythmic drugs: none Previous cardioversions: none Previous ablations: none CHADS2VASC score: 0 Anticoagulation history: none   No past medical history on file. Past Surgical History:  Procedure Laterality Date  . EXTRACORPOREAL SHOCK WAVE LITHOTRIPSY Left 06/08/2019   Procedure: EXTRACORPOREAL SHOCK WAVE LITHOTRIPSY (ESWL);  Surgeon:  [name], MD;  Location: [facility];  Service: Urology;  Laterality: Left;    Current Outpatient Medications  Medication Sig Dispense Refill  . HYDROcodone-acetaminophen (NORCO/VICODIN) 5-325 MG tablet Take 1 tablet by mouth every 6 (six) hours as needed for moderate pain (following kidney stone surgery). 20 tablet 0  . metoprolol succinate (TOPROL-XL) 25 MG 24 hr tablet Take 0.5 tablets (12.5 mg total) by mouth daily. 15 tablet 0  . tamsulosin (FLOMAX) 0.4 MG CAPS capsule Take 1 capsule (0.4 mg total) by mouth daily. 30 capsule 0  . rivaroxaban (XARELTO) 20 MG TABS tablet Take 1 tablet (20 mg total) by mouth daily with supper. 30 tablet 3   No current facility-administered medications for this encounter.    No Known Allergies  Social History   Socioeconomic History  . Marital status: Married    Spouse name: Not on file  . Number of children: Not on file  . Years of education: Not on file  . Highest education level: Not on file  Occupational History  . Not on file  Tobacco Use  . Smoking status: Never Smoker  . Smokeless tobacco: Never Used  Substance and Sexual Activity  . Alcohol use: Yes    Alcohol/week: 2.0 standard drinks    Types: 2 Cans of beer per week    Comment: occasional/social  . Drug use: Never  . Sexual activity: Not on file  Other Topics Concern  . Not on file  Social History Narrative  . Not on file   Social Determinants of Health   Financial Resource Strain:   . Difficulty of Paying Living Expenses: Not on file  Food Insecurity:   . Worried About Estate manager/land agent  of Food in the Last Year: Not on file  . Ran Out of Food in the Last Year: Not on file  Transportation Needs:   . Lack of Transportation (Medical): Not on file  . Lack of Transportation (Non-Medical): Not on file  Physical Activity:   . Days of Exercise per Week: Not on file  . Minutes of Exercise per Session: Not on file  Stress:   . Feeling of Stress : Not on file    Social Connections:   . Frequency of Communication with Friends and Family: Not on file  . Frequency of Social Gatherings with Friends and Family: Not on file  . Attends Religious Services: Not on file  . Active Member of Clubs or Organizations: Not on file  . Attends Banker Meetings: Not on file  . Marital Status: Not on file  Intimate Partner Violence:   . Fear of Current or Ex-Partner: Not on file  . Emotionally Abused: Not on file  . Physically Abused: Not on file  . Sexually Abused: Not on file     ROS- All systems are reviewed and negative except as per the HPI above.  Physical Exam: Vitals:   06/13/19 0841  BP: (!) 142/82  Pulse: 86  Weight: 91.5 kg  Height: 6\' 2"  (1.88 m)    GEN- The patient is well appearing, alert and oriented x 3 today.   Head- normocephalic, atraumatic Eyes-  Sclera clear, conjunctiva pink Ears- hearing intact Oropharynx- clear Neck- supple  Lungs- Clear to ausculation bilaterally, normal work of breathing Heart- irregular rate and rhythm, no murmurs, rubs or gallops  GI- soft, NT, ND, + BS Extremities- no clubbing, cyanosis, or edema MS- no significant deformity or atrophy Skin- no rash or lesion Psych- euthymic mood, full affect Neuro- strength and sensation are intact  Wt Readings from Last 3 Encounters:  06/13/19 91.5 kg  06/08/19 91.1 kg    EKG today demonstrates afib HR 86, QRS 98, QTc 440  Echo 06/08/19 demonstrated  1. Left ventricular ejection fraction, by estimation, is 60 to 65%. The  left ventricle has normal function. The left ventricle has no regional  wall motion abnormalities. LV diastolic filing cannot be assessed due to  underlying atrial fibrillation.  2. Right ventricular systolic function is normal. The right ventricular  size is normal. Tricuspid regurgitation signal is inadequate for assessing  PA pressure.  3. The mitral valve is normal in structure and function. No evidence of  mitral valve  regurgitation. No evidence of mitral stenosis.  4. The aortic valve is tricuspid. Aortic valve regurgitation is not  visualized. Mild aortic valve sclerosis is present, with no evidence of  aortic valve stenosis.  5. The inferior vena cava is dilated in size with >50% respiratory  variability, suggesting right atrial pressure of 8 mmHg.   Epic records are reviewed at length today  CHA2DS2-VASc Score = 0 The patient's score is based upon: CHF History: No HTN History: No Age : < 65 Diabetes History: No Stroke History: No Vascular Disease History: No Gender: Male      ASSESSMENT AND PLAN: 1. Persistent Atrial Fibrillation (ICD10:  I48.19) The patient's CHA2DS2-VASc score is 0, indicating a 0.2% annual risk of stroke.   General education about afib provided and questions answered. We also discussed his stroke risk and the risks and benefits of anticoagulation. Patient remains in rate controlled afib. Will plan for DCCV after 3 weeks of uninterrupted anticoagulation. Start Xarelto 20 mg daily. Anticipate  this will be short term with his low CHADS2VASC score. Continue Toprol 12.5 mg daily.  2. Suspected Obstructive sleep apnea/severe snoring The importance of adequate treatment of sleep apnea was discussed today in order to improve our ability to maintain sinus rhythm long term. Referral for sleep study pending per hospital discharge.   Follow up in the AF clinic in 2 weeks.   [name] PA-C [facility] [address] [place], [place] [address] [phone] 06/13/2019 9:32 AM

## 2019-06-14 ENCOUNTER — Telehealth: Payer: Self-pay | Admitting: *Deleted

## 2019-06-14 NOTE — Telephone Encounter (Signed)
PA for in lab sleep study submitted to [employer] via web portal. 

## 2019-06-19 ENCOUNTER — Telehealth (HOSPITAL_COMMUNITY): Payer: Self-pay | Admitting: *Deleted

## 2019-06-19 NOTE — Telephone Encounter (Signed)
Patient called in stating since starting Xarelto he has noticed his urine is darker in color. No other urinary symptoms. Pt did recently have lithotripsy just few days prior to starting xarelto. He is still intermittently passing small remnants of the stone. Discussed with [name] PA - recommends informing urology regarding symptoms to have urine tested today - pt is supposed to follow up with urology on 3/11. If urology has any issues with patient being on xarelto he will let me know or have urology send Clide Cliff a note.

## 2019-06-20 ENCOUNTER — Telehealth: Payer: Self-pay | Admitting: *Deleted

## 2019-06-20 ENCOUNTER — Other Ambulatory Visit: Payer: Self-pay | Admitting: Physician Assistant

## 2019-06-20 DIAGNOSIS — R400 Somnolence: Secondary | ICD-10-CM

## 2019-06-20 DIAGNOSIS — R0683 Snoring: Secondary | ICD-10-CM

## 2019-06-20 DIAGNOSIS — I4819 Other persistent atrial fibrillation: Secondary | ICD-10-CM

## 2019-06-20 NOTE — Telephone Encounter (Signed)
Staff message sent to [facility] denied in lab sleep study. Can order HST or provider can to peer to peer by calling [phone].

## 2019-06-21 ENCOUNTER — Telehealth: Payer: Self-pay | Admitting: *Deleted

## 2019-06-21 DIAGNOSIS — R400 Somnolence: Secondary | ICD-10-CM

## 2019-06-21 NOTE — Telephone Encounter (Signed)
-----   Message from Gaynelle Cage, CMA sent at 06/20/2019 10:51 AM EST ----- Regarding: RE: sleep study [employer] denied in lab sleep study.Not Medically Necessary. HST can be ordered or do peer to peer. [phone]. ----- Message ----- From: Shona Simpson, RN Sent: 06/13/2019   9:31 AM EST To: [facility] Sleep Studies Subject: sleep study                                    Pt needs a sleep study for severe snoring, daytime somnolence and afib per ricky fenton Thanks stacy

## 2019-06-21 NOTE — Telephone Encounter (Signed)
RE: home sleep study  Shona Simpson, RN  Reesa Chew, CMA  Ok for home study per clint fenton  Thanks  [employer]

## 2019-06-22 NOTE — Telephone Encounter (Signed)
Patient is aware and agreeable to Home Sleep Study through [facility]. Patient/Wife is scheduled for 08/24/19 at 9 am to pick up home sleep kit and meet with Respiratory therapist at [facility]. Patient is aware that if this appointment date and time does not work for them they should contact Artis Delay directly at [phone]. Patient is aware that a sleep packet will be sent from [facility] in week. Patient is agreeable to treatment and thankful for call

## 2019-06-26 NOTE — Progress Notes (Signed)
Primary Care Physician: Patient, No Pcp Per Primary Cardiologist: none Primary Electrophysiologist: none Referring Physician: Dr Boris Sharper is a 54 y.o. male with a history of nephrolithiasis and new onset atrial fibrillation who presents for consultation in the [facility].  The patient was initially diagnosed with atrial fibrillation 06/08/19 after presenting for a ESWL for kidney stones. Afib with RVR noted incidentally and he was asymptomatic. Patient is not on anticoagulation with a CHADS2VASC score of 0. He was started on metoprolol and discharged in rate controlled afib. Patient reports that he may feel a "flutter" occasionally but he denies any other symptoms. He does admit to severe snoring. He denies significant alcohol use.   On follow up today, patient reports doing very well since his last visit. He has remained very active by exercising regularly. He denies any palpitations or heart racing. He checks his pulse rate at home which as been in 70s-80s. He is tolerating the medication without difficulty. He has a home sleep study scheduled for May.  Today, he denies symptoms of chest pain, shortness of breath, orthopnea, PND, lower extremity edema, dizziness, presyncope, syncope, snoring, daytime somnolence, bleeding, or neurologic sequela. The patient is tolerating medications without difficulties and is otherwise without complaint today.    Atrial Fibrillation Risk Factors:  he does have symptoms or diagnosis of sleep apnea. Sleep study pending. he does not have a history of rheumatic fever. he does not have a history of alcohol use. The patient does not have a history of early familial atrial fibrillation or other arrhythmias.  he has a BMI of Body mass index is 26.27 kg/m.Marland Kitchen Filed Weights   06/27/19 0911  Weight: 92.8 kg    No family history on file.   Atrial Fibrillation Management history:  Previous antiarrhythmic drugs: none  Previous cardioversions: none Previous ablations: none CHADS2VASC score: 0 Anticoagulation history: none   No past medical history on file. Past Surgical History:  Procedure Laterality Date  . EXTRACORPOREAL SHOCK WAVE LITHOTRIPSY Left 06/08/2019   Procedure: EXTRACORPOREAL SHOCK WAVE LITHOTRIPSY (ESWL);  Surgeon: Noel Christmas, MD;  Location: [facility];  Service: Urology;  Laterality: Left;    Current Outpatient Medications  Medication Sig Dispense Refill  . HYDROcodone-acetaminophen (NORCO/VICODIN) 5-325 MG tablet Take 1 tablet by mouth every 6 (six) hours as needed for moderate pain (following kidney stone surgery). 20 tablet 0  . metoprolol succinate (TOPROL-XL) 25 MG 24 hr tablet Take 0.5 tablets (12.5 mg total) by mouth daily. 15 tablet 3  . rivaroxaban (XARELTO) 20 MG TABS tablet Take 1 tablet (20 mg total) by mouth daily with supper. 30 tablet 3  . tamsulosin (FLOMAX) 0.4 MG CAPS capsule Take 1 capsule (0.4 mg total) by mouth daily. 30 capsule 0   No current facility-administered medications for this encounter.    No Known Allergies  Social History   Socioeconomic History  . Marital status: Married    Spouse name: Not on file  . Number of children: Not on file  . Years of education: Not on file  . Highest education level: Not on file  Occupational History  . Not on file  Tobacco Use  . Smoking status: Never Smoker  . Smokeless tobacco: Never Used  Substance and Sexual Activity  . Alcohol use: Not Currently    Alcohol/week: 2.0 standard drinks    Types: 2 Cans of beer per week    Comment: occasional/social  . Drug use:  Never  . Sexual activity: Not on file  Other Topics Concern  . Not on file  Social History Narrative  . Not on file   Social Determinants of Health   Financial Resource Strain:   . Difficulty of Paying Living Expenses:   Food Insecurity:   . Worried About Programme researcher, broadcasting/film/video in the Last Year:   . Barista in the  Last Year:   Transportation Needs:   . Freight forwarder (Medical):   Marland Kitchen Lack of Transportation (Non-Medical):   Physical Activity:   . Days of Exercise per Week:   . Minutes of Exercise per Session:   Stress:   . Feeling of Stress :   Social Connections:   . Frequency of Communication with Friends and Family:   . Frequency of Social Gatherings with Friends and Family:   . Attends Religious Services:   . Active Member of Clubs or Organizations:   . Attends Banker Meetings:   Marland Kitchen Marital Status:   Intimate Partner Violence:   . Fear of Current or Ex-Partner:   . Emotionally Abused:   Marland Kitchen Physically Abused:   . Sexually Abused:      ROS- All systems are reviewed and negative except as per the HPI above.  Physical Exam: Vitals:   06/27/19 0911  BP: 122/82  Pulse: 92  Weight: 92.8 kg  Height: 6\' 2"  (1.88 m)    GEN- The patient is well appearing, alert and oriented x 3 today.   HEENT-head normocephalic, atraumatic, sclera clear, conjunctiva pink, hearing intact, trachea midline. Lungs- Clear to ausculation bilaterally, normal work of breathing Heart- irregular rate and rhythm, no murmurs, rubs or gallops  GI- soft, NT, ND, + BS Extremities- no clubbing, cyanosis, or edema MS- no significant deformity or atrophy Skin- no rash or lesion Psych- euthymic mood, full affect Neuro- strength and sensation are intact   Wt Readings from Last 3 Encounters:  06/27/19 92.8 kg  06/13/19 91.5 kg  06/08/19 91.1 kg    EKG today demonstrates afib HR 92, QRS 100, QTc 460  Echo 06/08/19 demonstrated  1. Left ventricular ejection fraction, by estimation, is 60 to 65%. The  left ventricle has normal function. The left ventricle has no regional  wall motion abnormalities. LV diastolic filing cannot be assessed due to  underlying atrial fibrillation.  2. Right ventricular systolic function is normal. The right ventricular  size is normal. Tricuspid regurgitation signal  is inadequate for assessing  PA pressure.  3. The mitral valve is normal in structure and function. No evidence of  mitral valve regurgitation. No evidence of mitral stenosis.  4. The aortic valve is tricuspid. Aortic valve regurgitation is not  visualized. Mild aortic valve sclerosis is present, with no evidence of  aortic valve stenosis.  5. The inferior vena cava is dilated in size with >50% respiratory  variability, suggesting right atrial pressure of 8 mmHg.   Epic records are reviewed at length today  CHA2DS2-VASc Score = 0 The patient's score is based upon: CHF History: No HTN History: No Age : < 65 Diabetes History: No Stroke History: No Vascular Disease History: No Gender: Male   ASSESSMENT AND PLAN: 1. Persistent Atrial Fibrillation (ICD10:  I48.19) The patient's CHA2DS2-VASc score is 0, indicating a 0.2% annual risk of stroke.   We discussed therapeutic options today. Patient hesitant to proceed with cardioversion, chemical or electrical, given his paucity of symptoms. We did discuss the EAST-AFNET trial which showed  improved CV outcomes with early rhythm control. With his young age and no other major health issues, I would prefer a trial of SR. Patient would like to wait until after his sleep study. Patient did voice understanding that it may be more difficult to get back to SR the longer he is in afib.  Continue Xarelto 20 mg daily for now.  Continue Toprol 12.5 mg daily.  2. Suspected Obstructive sleep apnea/severe snoring The importance of adequate treatment of sleep apnea was discussed today in order to improve our ability to maintain sinus rhythm long term. Sleep study pending.   Follow up in the AF clinic in one month.    [facility] [address] [place],  [address] [phone] 06/27/2019 11:22 AM

## 2019-06-27 ENCOUNTER — Other Ambulatory Visit: Payer: Self-pay

## 2019-06-27 ENCOUNTER — Encounter (HOSPITAL_COMMUNITY): Payer: Self-pay | Admitting: Physician Assistant

## 2019-06-27 ENCOUNTER — Ambulatory Visit (HOSPITAL_COMMUNITY)
Admission: RE | Admit: 2019-06-27 | Discharge: 2019-06-27 | Disposition: A | Discharge status: Home / Self Care | Payer: 59 | Source: Ambulatory Visit | Attending: Physician Assistant | Admitting: Physician Assistant

## 2019-06-27 VITALS — BP 122/82 | HR 92 | Ht 74.0 in | Wt 204.6 lb

## 2019-06-27 DIAGNOSIS — Z7901 Long term (current) use of anticoagulants: Secondary | ICD-10-CM | POA: Insufficient documentation

## 2019-06-27 DIAGNOSIS — Z87442 Personal history of urinary calculi: Secondary | ICD-10-CM | POA: Insufficient documentation

## 2019-06-27 DIAGNOSIS — Z79899 Other long term (current) drug therapy: Secondary | ICD-10-CM | POA: Diagnosis not present

## 2019-06-27 DIAGNOSIS — R0683 Snoring: Secondary | ICD-10-CM | POA: Diagnosis not present

## 2019-06-27 DIAGNOSIS — I4819 Other persistent atrial fibrillation: Secondary | ICD-10-CM | POA: Diagnosis not present

## 2019-06-27 MED ORDER — METOPROLOL SUCCINATE ER 25 MG PO TB24: 12.5000 mg | ORAL_TABLET | Freq: Every day | ORAL | 3 refills | Status: DC

## 2019-07-07 ENCOUNTER — Other Ambulatory Visit (HOSPITAL_COMMUNITY): Payer: 59

## 2019-07-31 ENCOUNTER — Encounter (HOSPITAL_COMMUNITY): Payer: Self-pay | Admitting: Physician Assistant

## 2019-07-31 ENCOUNTER — Other Ambulatory Visit: Payer: Self-pay

## 2019-07-31 ENCOUNTER — Ambulatory Visit (HOSPITAL_COMMUNITY)
Admission: RE | Admit: 2019-07-31 | Discharge: 2019-07-31 | Disposition: A | Discharge status: Home / Self Care | Payer: 59 | Source: Ambulatory Visit | Attending: Physician Assistant | Admitting: Physician Assistant

## 2019-07-31 VITALS — BP 138/80 | HR 66 | Ht 74.0 in | Wt 204.8 lb

## 2019-07-31 DIAGNOSIS — G4733 Obstructive sleep apnea (adult) (pediatric): Secondary | ICD-10-CM | POA: Insufficient documentation

## 2019-07-31 DIAGNOSIS — Z7901 Long term (current) use of anticoagulants: Secondary | ICD-10-CM | POA: Diagnosis not present

## 2019-07-31 DIAGNOSIS — Z87442 Personal history of urinary calculi: Secondary | ICD-10-CM | POA: Diagnosis not present

## 2019-07-31 DIAGNOSIS — I4819 Other persistent atrial fibrillation: Secondary | ICD-10-CM

## 2019-07-31 DIAGNOSIS — I4891 Unspecified atrial fibrillation: Secondary | ICD-10-CM | POA: Diagnosis present

## 2019-07-31 DIAGNOSIS — Z79899 Other long term (current) drug therapy: Secondary | ICD-10-CM | POA: Insufficient documentation

## 2019-07-31 LAB — BASIC METABOLIC PANEL
Anion gap: 9 (ref 5–15)
BUN: 11 mg/dL (ref 6–20)
CO2: 25 mmol/L (ref 22–32)
Calcium: 9.3 mg/dL (ref 8.9–10.3)
Chloride: 107 mmol/L (ref 98–111)
Creatinine, Ser: 1.18 mg/dL (ref 0.61–1.24)
GFR calc Af Amer: >60 mL/min (ref >60)
GFR calc non Af Amer: >60 mL/min (ref >60)
Glucose, Bld: 105 mg/dL — ABNORMAL HIGH (ref 70–99)
Potassium: 4.7 mmol/L (ref 3.5–5.1)
Sodium: 141 mmol/L (ref 135–145)

## 2019-07-31 LAB — CBC
HCT: 47.5 % (ref 39.0–52.0)
Hemoglobin: 15.2 g/dL (ref 13.0–17.0)
MCH: 28.3 pg (ref 26.0–34.0)
MCHC: 32 g/dL (ref 30.0–36.0)
MCV: 88.5 fL (ref 80.0–100.0)
Platelets: 254 10*3/uL (ref 150–400)
RBC: 5.37 MIL/uL (ref 4.22–5.81)
RDW: 12.5 % (ref 11.5–15.5)
WBC: 5.8 10*3/uL (ref 4.0–10.5)
nRBC: 0 % (ref 0.0–0.2)

## 2019-07-31 NOTE — Progress Notes (Signed)
  Primary Care Physician: Patient, No Pcp Per Primary Cardiologist: none Primary Electrophysiologist: none Referring Physician: Dr Dahal   [name] is a 54 y.o. male with a history of nephrolithiasis and new onset atrial fibrillation who presents for follow up in the [facility].  The patient was initially diagnosed with atrial fibrillation 06/08/19 after presenting for a ESWL for kidney stones. Afib with RVR noted incidentally and he was asymptomatic. Patient is not on anticoagulation with a CHADS2VASC score of 0. He was started on metoprolol and discharged in rate controlled afib. Patient reports that he may feel a "flutter" occasionally but he denies any other symptoms. He does admit to severe snoring. He denies significant alcohol use.   On follow up today, patient reports he is doing well since his last visit. He has been diagnosed with OSA and is waiting for his CPAP to be delivered. He denies missed doses of anticoagulation.   Today, he denies symptoms of palpitations, chest pain, shortness of breath, orthopnea, PND, lower extremity edema, dizziness, presyncope, syncope, snoring, daytime somnolence, bleeding, or neurologic sequela. The patient is tolerating medications without difficulties and is otherwise without complaint today.    Atrial Fibrillation Risk Factors:  he does have symptoms or diagnosis of sleep apnea. Sleep study pending. he does not have a history of rheumatic fever. he does not have a history of alcohol use. The patient does not have a history of early familial atrial fibrillation or other arrhythmias.  he has a BMI of Body mass index is 26.29 kg/m.. Filed Weights   07/31/19 0914  Weight: 92.9 kg    No family history on file.   Atrial Fibrillation Management history:  Previous antiarrhythmic drugs: none Previous cardioversions: none Previous ablations: none CHADS2VASC score: 0 Anticoagulation history: none   No past  medical history on file. Past Surgical History:  Procedure Laterality Date  . EXTRACORPOREAL SHOCK WAVE LITHOTRIPSY Left 06/08/2019   Procedure: EXTRACORPOREAL SHOCK WAVE LITHOTRIPSY (ESWL);  Surgeon: Pace, Maryellen D, MD;  Location: [facility];  Service: Urology;  Laterality: Left;    Current Outpatient Medications  Medication Sig Dispense Refill  . metoprolol succinate (TOPROL-XL) 25 MG 24 hr tablet Take 0.5 tablets (12.5 mg total) by mouth daily. 15 tablet 3  . rivaroxaban (XARELTO) 20 MG TABS tablet Take 1 tablet (20 mg total) by mouth daily with supper. 30 tablet 3  . tamsulosin (FLOMAX) 0.4 MG CAPS capsule Take 0.4 mg by mouth daily.     No current facility-administered medications for this encounter.    No Known Allergies  Social History   Socioeconomic History  . Marital status: Married    Spouse name: Not on file  . Number of children: Not on file  . Years of education: Not on file  . Highest education level: Not on file  Occupational History  . Not on file  Tobacco Use  . Smoking status: Never Smoker  . Smokeless tobacco: Never Used  Substance and Sexual Activity  . Alcohol use: Not Currently    Alcohol/week: 2.0 standard drinks    Types: 2 Cans of beer per week    Comment: occasional/social  . Drug use: Never  . Sexual activity: Not on file  Other Topics Concern  . Not on file  Social History Narrative  . Not on file   Social Determinants of Health   Financial Resource Strain:   . Difficulty of Paying Living Expenses:   Food Insecurity:   .   Worried About Charity fundraiser in the Last Year:   . Arboriculturist in the Last Year:   Transportation Needs:   . Film/video editor (Medical):   Marland Kitchen Lack of Transportation (Non-Medical):   Physical Activity:   . Days of Exercise per Week:   . Minutes of Exercise per Session:   Stress:   . Feeling of Stress :   Social Connections:   . Frequency of Communication with Friends and Family:     . Frequency of Social Gatherings with Friends and Family:   . Attends Religious Services:   . Active Member of Clubs or Organizations:   . Attends Archivist Meetings:   Marland Kitchen Marital Status:   Intimate Partner Violence:   . Fear of Current or Ex-Partner:   . Emotionally Abused:   Marland Kitchen Physically Abused:   . Sexually Abused:      ROS- All systems are reviewed and negative except as per the HPI above.  Physical Exam: Vitals:   07/31/19 0914  BP: 138/80  Pulse: 66  Weight: 92.9 kg  Height: 6\' 2"  (1.88 m)    GEN- The patient is well appearing, alert and oriented x 3 today.   HEENT-head normocephalic, atraumatic, sclera clear, conjunctiva pink, hearing intact, trachea midline. Lungs- Clear to ausculation bilaterally, normal work of breathing Heart- irregular rate and rhythm, no murmurs, rubs or gallops  GI- soft, NT, ND, + BS Extremities- no clubbing, cyanosis, or edema MS- no significant deformity or atrophy Skin- no rash or lesion Psych- euthymic mood, full affect Neuro- strength and sensation are intact   Wt Readings from Last 3 Encounters:  07/31/19 92.9 kg  06/27/19 92.8 kg  06/13/19 91.5 kg    EKG today demonstrates afib HR 66, QRS 100, QTc 423  Echo 06/08/19 demonstrated  1. Left ventricular ejection fraction, by estimation, is 60 to 65%. The  left ventricle has normal function. The left ventricle has no regional  wall motion abnormalities. LV diastolic filing cannot be assessed due to  underlying atrial fibrillation.  2. Right ventricular systolic function is normal. The right ventricular  size is normal. Tricuspid regurgitation signal is inadequate for assessing  PA pressure.  3. The mitral valve is normal in structure and function. No evidence of  mitral valve regurgitation. No evidence of mitral stenosis.  4. The aortic valve is tricuspid. Aortic valve regurgitation is not  visualized. Mild aortic valve sclerosis is present, with no evidence of   aortic valve stenosis.  5. The inferior vena cava is dilated in size with >50% respiratory  variability, suggesting right atrial pressure of 8 mmHg.   Epic records are reviewed at length today  CHA2DS2-VASc Score = 0 The patient's score is based upon: CHF History: No HTN History: No Age : < 65 Diabetes History: No Stroke History: No Vascular Disease History: No Gender: Male   ASSESSMENT AND PLAN: 1. Persistent Atrial Fibrillation (ICD10:  I48.19) The patient's CHA2DS2-VASc score is 0, indicating a 0.2% annual risk of stroke.   Will plan for DCCV. Check bmet/CBC today. Continue Xarelto 20 mg daily. Patient denies missed doses in the last 3 weeks.  Continue Toprol 12.5 mg daily.  2. OSA The importance of adequate treatment of sleep apnea was discussed today in order to improve our ability to maintain sinus rhythm long term. Recently diagnosed.  Followed by Dr Isidoro Donning.    Follow up in the AF clinic one week post DCCV.   Adline Peals PA-C  [facility] [address] [place], [place] [address] [phone] 07/31/2019 9:59 AM

## 2019-07-31 NOTE — H&P (View-Only) (Signed)
Primary Care Physician: Patient, No Pcp Per Primary Cardiologist: none Primary Electrophysiologist: none Referring Physician: Dr Boris Sharper is a 54 y.o. male with a history of nephrolithiasis and new onset atrial fibrillation who presents for follow up in the [facility].  The patient was initially diagnosed with atrial fibrillation 06/08/19 after presenting for a ESWL for kidney stones. Afib with RVR noted incidentally and he was asymptomatic. Patient is not on anticoagulation with a CHADS2VASC score of 0. He was started on metoprolol and discharged in rate controlled afib. Patient reports that he may feel a "flutter" occasionally but he denies any other symptoms. He does admit to severe snoring. He denies significant alcohol use.   On follow up today, patient reports he is doing well since his last visit. He has been diagnosed with OSA and is waiting for his CPAP to be delivered. He denies missed doses of anticoagulation.   Today, he denies symptoms of palpitations, chest pain, shortness of breath, orthopnea, PND, lower extremity edema, dizziness, presyncope, syncope, snoring, daytime somnolence, bleeding, or neurologic sequela. The patient is tolerating medications without difficulties and is otherwise without complaint today.    Atrial Fibrillation Risk Factors:  he does have symptoms or diagnosis of sleep apnea. Sleep study pending. he does not have a history of rheumatic fever. he does not have a history of alcohol use. The patient does not have a history of early familial atrial fibrillation or other arrhythmias.  he has a BMI of Body mass index is 26.29 kg/m.Marland Kitchen Filed Weights   07/31/19 0914  Weight: 92.9 kg    No family history on file.   Atrial Fibrillation Management history:  Previous antiarrhythmic drugs: none Previous cardioversions: none Previous ablations: none CHADS2VASC score: 0 Anticoagulation history: none   No past  medical history on file. Past Surgical History:  Procedure Laterality Date  . EXTRACORPOREAL SHOCK WAVE LITHOTRIPSY Left 06/08/2019   Procedure: EXTRACORPOREAL SHOCK WAVE LITHOTRIPSY (ESWL);  Surgeon: Noel Christmas, MD;  Location: [facility];  Service: Urology;  Laterality: Left;    Current Outpatient Medications  Medication Sig Dispense Refill  . metoprolol succinate (TOPROL-XL) 25 MG 24 hr tablet Take 0.5 tablets (12.5 mg total) by mouth daily. 15 tablet 3  . rivaroxaban (XARELTO) 20 MG TABS tablet Take 1 tablet (20 mg total) by mouth daily with supper. 30 tablet 3  . tamsulosin (FLOMAX) 0.4 MG CAPS capsule Take 0.4 mg by mouth daily.     No current facility-administered medications for this encounter.    No Known Allergies  Social History   Socioeconomic History  . Marital status: Married    Spouse name: Not on file  . Number of children: Not on file  . Years of education: Not on file  . Highest education level: Not on file  Occupational History  . Not on file  Tobacco Use  . Smoking status: Never Smoker  . Smokeless tobacco: Never Used  Substance and Sexual Activity  . Alcohol use: Not Currently    Alcohol/week: 2.0 standard drinks    Types: 2 Cans of beer per week    Comment: occasional/social  . Drug use: Never  . Sexual activity: Not on file  Other Topics Concern  . Not on file  Social History Narrative  . Not on file   Social Determinants of Health   Financial Resource Strain:   . Difficulty of Paying Living Expenses:   Food Insecurity:   .  Worried About Charity fundraiser in the Last Year:   . Arboriculturist in the Last Year:   Transportation Needs:   . Film/video editor (Medical):   Marland Kitchen Lack of Transportation (Non-Medical):   Physical Activity:   . Days of Exercise per Week:   . Minutes of Exercise per Session:   Stress:   . Feeling of Stress :   Social Connections:   . Frequency of Communication with Friends and Family:     . Frequency of Social Gatherings with Friends and Family:   . Attends Religious Services:   . Active Member of Clubs or Organizations:   . Attends Archivist Meetings:   Marland Kitchen Marital Status:   Intimate Partner Violence:   . Fear of Current or Ex-Partner:   . Emotionally Abused:   Marland Kitchen Physically Abused:   . Sexually Abused:      ROS- All systems are reviewed and negative except as per the HPI above.  Physical Exam: Vitals:   07/31/19 0914  BP: 138/80  Pulse: 66  Weight: 92.9 kg  Height: 6\' 2"  (1.88 m)    GEN- The patient is well appearing, alert and oriented x 3 today.   HEENT-head normocephalic, atraumatic, sclera clear, conjunctiva pink, hearing intact, trachea midline. Lungs- Clear to ausculation bilaterally, normal work of breathing Heart- irregular rate and rhythm, no murmurs, rubs or gallops  GI- soft, NT, ND, + BS Extremities- no clubbing, cyanosis, or edema MS- no significant deformity or atrophy Skin- no rash or lesion Psych- euthymic mood, full affect Neuro- strength and sensation are intact   Wt Readings from Last 3 Encounters:  07/31/19 92.9 kg  06/27/19 92.8 kg  06/13/19 91.5 kg    EKG today demonstrates afib HR 66, QRS 100, QTc 423  Echo 06/08/19 demonstrated  1. Left ventricular ejection fraction, by estimation, is 60 to 65%. The  left ventricle has normal function. The left ventricle has no regional  wall motion abnormalities. LV diastolic filing cannot be assessed due to  underlying atrial fibrillation.  2. Right ventricular systolic function is normal. The right ventricular  size is normal. Tricuspid regurgitation signal is inadequate for assessing  PA pressure.  3. The mitral valve is normal in structure and function. No evidence of  mitral valve regurgitation. No evidence of mitral stenosis.  4. The aortic valve is tricuspid. Aortic valve regurgitation is not  visualized. Mild aortic valve sclerosis is present, with no evidence of   aortic valve stenosis.  5. The inferior vena cava is dilated in size with >50% respiratory  variability, suggesting right atrial pressure of 8 mmHg.   Epic records are reviewed at length today  CHA2DS2-VASc Score = 0 The patient's score is based upon: CHF History: No HTN History: No Age : < 65 Diabetes History: No Stroke History: No Vascular Disease History: No Gender: Male   ASSESSMENT AND PLAN: 1. Persistent Atrial Fibrillation (ICD10:  I48.19) The patient's CHA2DS2-VASc score is 0, indicating a 0.2% annual risk of stroke.   Will plan for DCCV. Check bmet/CBC today. Continue Xarelto 20 mg daily. Patient denies missed doses in the last 3 weeks.  Continue Toprol 12.5 mg daily.  2. OSA The importance of adequate treatment of sleep apnea was discussed today in order to improve our ability to maintain sinus rhythm long term. Recently diagnosed.  Followed by Dr Isidoro Donning.    Follow up in the AF clinic one week post DCCV.   Adline Peals PA-C  Afib Clinic [facility] [address] [place], [place] [address] [phone] 07/31/2019 9:59 AM

## 2019-07-31 NOTE — Patient Instructions (Signed)
Cardioversion scheduled for Tuesday, April 27th  - Arrive at the [facility] and go to admitting at [facility]  -Do not eat or drink anything after midnight the night prior to your procedure.  - Take all your morning medication with a sip of water prior to arrival.  - You will not be able to drive home after your procedure.

## 2019-08-05 ENCOUNTER — Other Ambulatory Visit (HOSPITAL_COMMUNITY)
Admission: RE | Admit: 2019-08-05 | Discharge: 2019-08-05 | Disposition: A | Discharge status: Home / Self Care | Payer: 59 | Source: Ambulatory Visit | Attending: Cardiovascular Disease | Admitting: Cardiovascular Disease

## 2019-08-05 DIAGNOSIS — Z20822 Contact with and (suspected) exposure to covid-19: Secondary | ICD-10-CM | POA: Diagnosis not present

## 2019-08-05 DIAGNOSIS — Z01812 Encounter for preprocedural laboratory examination: Secondary | ICD-10-CM | POA: Insufficient documentation

## 2019-08-05 LAB — SARS CORONAVIRUS 2 (TAT 6-24 HRS): SARS Coronavirus 2: NEGATIVE

## 2019-08-08 ENCOUNTER — Encounter (HOSPITAL_COMMUNITY): Payer: Self-pay | Admitting: Cardiovascular Disease

## 2019-08-08 ENCOUNTER — Ambulatory Visit (HOSPITAL_COMMUNITY): Payer: 59 | Admitting: Certified Registered Nurse Anesthetist

## 2019-08-08 ENCOUNTER — Encounter (HOSPITAL_COMMUNITY)
Admission: RE | Disposition: A | Discharge status: Home / Self Care | Payer: Self-pay | Source: Home / Self Care | Attending: Cardiovascular Disease

## 2019-08-08 ENCOUNTER — Ambulatory Visit (HOSPITAL_COMMUNITY)
Admission: RE | Admit: 2019-08-08 | Discharge: 2019-08-08 | Disposition: A | Discharge status: Home / Self Care | Payer: 59 | Attending: Cardiovascular Disease | Admitting: Cardiovascular Disease

## 2019-08-08 ENCOUNTER — Other Ambulatory Visit: Payer: Self-pay

## 2019-08-08 DIAGNOSIS — I4819 Other persistent atrial fibrillation: Secondary | ICD-10-CM | POA: Insufficient documentation

## 2019-08-08 DIAGNOSIS — G4733 Obstructive sleep apnea (adult) (pediatric): Secondary | ICD-10-CM | POA: Insufficient documentation

## 2019-08-08 DIAGNOSIS — Z7901 Long term (current) use of anticoagulants: Secondary | ICD-10-CM | POA: Insufficient documentation

## 2019-08-08 DIAGNOSIS — Z79899 Other long term (current) drug therapy: Secondary | ICD-10-CM | POA: Insufficient documentation

## 2019-08-08 HISTORY — PX: CARDIOVERSION: SHX1299

## 2019-08-08 HISTORY — DX: Sleep apnea, unspecified: G47.30

## 2019-08-08 SURGERY — CARDIOVERSION
Anesthesia: General

## 2019-08-08 MED ORDER — PROPOFOL 10 MG/ML IV BOLUS
INTRAVENOUS | Status: DC | PRN
Start: 2019-08-08 — End: 2019-08-08
  Administered 2019-08-08: 30 mg via INTRAVENOUS
  Administered 2019-08-08: 20 mg via INTRAVENOUS
  Administered 2019-08-08: 70 mg via INTRAVENOUS
  Administered 2019-08-08: 60 mg via INTRAVENOUS
  Administered 2019-08-08: 20 mg via INTRAVENOUS

## 2019-08-08 MED ORDER — SODIUM CHLORIDE 0.9 % IV SOLN: INTRAVENOUS | Status: DC | PRN

## 2019-08-08 MED ORDER — LIDOCAINE 2% (20 MG/ML) 5 ML SYRINGE
INTRAMUSCULAR | Status: DC | PRN
  Administered 2019-08-08: 40 mg via INTRAVENOUS

## 2019-08-08 NOTE — Transfer of Care (Signed)
Immediate Anesthesia Transfer of Care Note  Patient: [name]  Procedure(s) Performed: CARDIOVERSION (N/A )  Patient Location: Endoscopy Unit  Anesthesia Type:General  Level of Consciousness: drowsy  Airway & Oxygen Therapy: Patient Spontanous Breathing and Patient connected to nasal cannula oxygen  Post-op Assessment: Report given to RN and Post -op Vital signs reviewed and stable  Post vital signs: Reviewed and stable  Last Vitals:  Vitals Value Taken Time  BP    Temp    Pulse    Resp    SpO2      Last Pain:  Vitals:   08/08/19 0939  TempSrc: Tympanic  PainSc: 0-No pain         Complications: No apparent anesthesia complications

## 2019-08-08 NOTE — CV Procedure (Signed)
Electrical Cardioversion Procedure Note SANTI TROUNG [id number] 1965-12-22  Procedure: Electrical Cardioversion Indications:  Atrial Fibrillation  Procedure Details Consent: Risks of procedure as well as the alternatives and risks of each were explained to the (patient/caregiver).  Consent for procedure obtained. Time Out: Verified patient identification, verified procedure, site/side was marked, verified correct patient position, special equipment/implants available, medications/allergies/relevent history reviewed, required imaging and test results available.  Performed  Patient placed on cardiac monitor, pulse oximetry, supplemental oxygen as necessary.  Sedation given: propofol Pacer pads placed anterior and posterior chest.  Cardioverted 3 time(s).  Cardioverted at 150J, 200J, 200J with pressure applied all unsuccessful.  Evaluation Findings: Post procedure EKG shows: Atrial Fibrillation Complications: None Patient did tolerate procedure well.   Chilton Si , MD 08/08/2019, 10:06 AM

## 2019-08-08 NOTE — Anesthesia Postprocedure Evaluation (Signed)
Anesthesia Post Note  Patient: [name]  Procedure(s) Performed: CARDIOVERSION (N/A )     Anesthesia Post Evaluation  Last Vitals:  Vitals:   08/08/19 1015 08/08/19 1025  BP: 136/78 124/71  Pulse: 93 82  Resp: 14 20  Temp:    SpO2: 100% 100%    Last Pain:  Vitals:   08/08/19 1025  TempSrc:   PainSc: 0-No pain                 Eleri Ruben

## 2019-08-08 NOTE — Discharge Instructions (Signed)
Electrical Cardioversion   What can I expect after the procedure?  Your blood pressure, heart rate, breathing rate, and blood oxygen level will be monitored until you leave the hospital or clinic.  Your heart rhythm will be watched to make sure it does not change.  You may have some redness on the skin where the shocks were given. Follow these instructions at home:  Do not drive for 24 hours if you were given a sedative during your procedure.  Take over-the-counter and prescription medicines only as told by your health care provider.  Ask your health care provider how to check your pulse. Check it often.  Rest for 48 hours after the procedure or as told by your health care provider.  Avoid or limit your caffeine use as told by your health care provider.  Keep all follow-up visits as told by your health care provider. This is important. Contact a health care provider if:  You feel like your heart is beating too quickly or your pulse is not regular.  You have a serious muscle cramp that does not go away. Get help right away if:  You have discomfort in your chest.  You are dizzy or you feel faint.  You have trouble breathing or you are short of breath.  Your speech is slurred.  You have trouble moving an arm or leg on one side of your body.  Your fingers or toes turn cold or blue. Summary  Electrical cardioversion is the delivery of a jolt of electricity to restore a normal rhythm to the heart.  This procedure may be done right away in an emergency or may be a scheduled procedure if the condition is not an emergency.  Generally, this is a safe procedure.  After the procedure, check your pulse often as told by your health care provider. This information is not intended to replace advice given to you by your health care provider. Make sure you discuss any questions you have with your health care provider. Document Revised: 10/31/2018 Document Reviewed:  10/31/2018 Elsevier Patient Education  2020 Elsevier Inc.  

## 2019-08-08 NOTE — Interval H&P Note (Signed)
History and Physical Interval Note:  08/08/2019 9:52 AM  [name]  has presented today for surgery, with the diagnosis of A-FIB.  The various methods of treatment have been discussed with the patient and family. After consideration of risks, benefits and other options for treatment, the patient has consented to  Procedure(s): CARDIOVERSION (N/A) as a surgical intervention.  The patient's history has been reviewed, patient examined, no change in status, stable for surgery.  I have reviewed the patient's chart and labs.  Questions were answered to the patient's satisfaction.     Chilton Si, MD

## 2019-08-08 NOTE — Anesthesia Preprocedure Evaluation (Addendum)
Anesthesia Evaluation  Patient identified by MRN, date of birth, ID band Patient awake    Reviewed: Allergy & Precautions, NPO status , Patient's Chart, lab work & pertinent test results  Airway Mallampati: I  TM Distance: >3 FB Neck ROM: Full    Dental  (+) Teeth Intact, Caps, Dental Advisory Given   Pulmonary    breath sounds clear to auscultation       Cardiovascular  Rhythm:Regular Rate:Normal  History noted CG   Neuro/Psych    GI/Hepatic negative GI ROS, Neg liver ROS,   Endo/Other  negative endocrine ROS  Renal/GU negative Renal ROS     Musculoskeletal   Abdominal   Peds  Hematology   Anesthesia Other Findings   Reproductive/Obstetrics                            Anesthesia Physical Anesthesia Plan  ASA: III  Anesthesia Plan: General   Post-op Pain Management:    Induction: Intravenous  PONV Risk Score and Plan: 2 and Ondansetron and Midazolam  Airway Management Planned: Nasal Cannula and Simple Face Mask  Additional Equipment:   Intra-op Plan:   Post-operative Plan:   Informed Consent: I have reviewed the patients History and Physical, chart, labs and discussed the procedure including the risks, benefits and alternatives for the proposed anesthesia with the patient or authorized representative who has indicated his/her understanding and acceptance.     Dental advisory given  Plan Discussed with: Anesthesiologist and CRNA  Anesthesia Plan Comments:         Anesthesia Quick Evaluation

## 2019-08-10 ENCOUNTER — Other Ambulatory Visit: Payer: Self-pay

## 2019-08-10 ENCOUNTER — Encounter (HOSPITAL_COMMUNITY): Payer: Self-pay | Admitting: Physician Assistant

## 2019-08-10 ENCOUNTER — Ambulatory Visit (HOSPITAL_COMMUNITY)
Admission: RE | Admit: 2019-08-10 | Discharge: 2019-08-10 | Disposition: A | Discharge status: Home / Self Care | Payer: 59 | Source: Ambulatory Visit | Attending: Physician Assistant | Admitting: Physician Assistant

## 2019-08-10 VITALS — BP 140/90 | HR 82 | Ht 74.0 in | Wt 204.2 lb

## 2019-08-10 DIAGNOSIS — I4819 Other persistent atrial fibrillation: Secondary | ICD-10-CM | POA: Insufficient documentation

## 2019-08-10 DIAGNOSIS — Z87442 Personal history of urinary calculi: Secondary | ICD-10-CM | POA: Insufficient documentation

## 2019-08-10 DIAGNOSIS — I4891 Unspecified atrial fibrillation: Secondary | ICD-10-CM | POA: Diagnosis present

## 2019-08-10 DIAGNOSIS — Z7901 Long term (current) use of anticoagulants: Secondary | ICD-10-CM | POA: Insufficient documentation

## 2019-08-10 DIAGNOSIS — Z79899 Other long term (current) drug therapy: Secondary | ICD-10-CM | POA: Diagnosis not present

## 2019-08-10 DIAGNOSIS — G4733 Obstructive sleep apnea (adult) (pediatric): Secondary | ICD-10-CM | POA: Diagnosis not present

## 2019-08-10 MED ORDER — FLECAINIDE ACETATE 50 MG PO TABS
50.0000 mg | ORAL_TABLET | Freq: Two times a day (BID) | ORAL | 3 refills | Status: DC
Start: 2019-08-10 — End: 2019-08-15

## 2019-08-10 NOTE — Progress Notes (Signed)
Primary Care Physician: Orpah Melter, MD Primary Cardiologist: none Primary Electrophysiologist: none Referring Physician: Dr Collins [name] is a 54 y.o. male with a history of nephrolithiasis, OSA, and new onset atrial fibrillation who presents for follow up in the [facility].  The patient was initially diagnosed with atrial fibrillation 06/08/19 after presenting for a ESWL for kidney stones. Afib with RVR noted incidentally and he was asymptomatic. Patient is not on anticoagulation with a CHADS2VASC score of 0. He was started on metoprolol and discharged in rate controlled afib. Patient reports that he may feel a "flutter" occasionally but he denies any other symptoms. He denies significant alcohol use.   On follow up today, patient is s/p unsuccessful DCCV on 08/08/19. Patient has started using his CPAP machine. He did have some chest/skin discomfort following the DCCV but this has resolved. He denies any bleeding issues on anticoagulation.   Today, he denies symptoms of palpitations, chest pain, shortness of breath, orthopnea, PND, lower extremity edema, dizziness, presyncope, syncope, snoring, daytime somnolence, bleeding, or neurologic sequela. The patient is tolerating medications without difficulties and is otherwise without complaint today.    Atrial Fibrillation Risk Factors:  he does have symptoms or diagnosis of sleep apnea.  He is compliant with CPAP therapy. he does not have a history of rheumatic fever. he does not have a history of alcohol use. The patient does not have a history of early familial atrial fibrillation or other arrhythmias.  he has a BMI of Body mass index is 26.22 kg/m.Marland Kitchen Filed Weights   08/10/19 0943  Weight: 92.6 kg    No family history on file.   Atrial Fibrillation Management history:  Previous antiarrhythmic drugs: none Previous cardioversions: 08/08/19 Previous ablations: none CHADS2VASC score:  0 Anticoagulation history: none   Past Medical History:  Diagnosis Date  . Sleep apnea    Past Surgical History:  Procedure Laterality Date  . CARDIOVERSION N/A 08/08/2019   Procedure: CARDIOVERSION;  Surgeon: [name], MD;  Location: [facility];  Service: Cardiovascular;  Laterality: N/A;  . EXTRACORPOREAL SHOCK WAVE LITHOTRIPSY Left 06/08/2019   Procedure: EXTRACORPOREAL SHOCK WAVE LITHOTRIPSY (ESWL);  Surgeon: [name], MD;  Location: [facility];  Service: Urology;  Laterality: Left;    Current Outpatient Medications  Medication Sig Dispense Refill  . metoprolol succinate (TOPROL-XL) 25 MG 24 hr tablet Take 0.5 tablets (12.5 mg total) by mouth daily. 15 tablet 3  . prednisoLONE acetate (PRED FORTE) 1 % ophthalmic suspension Place 1 drop into both eyes daily as needed (redness and swelling).    . rivaroxaban (XARELTO) 20 MG TABS tablet Take 1 tablet (20 mg total) by mouth daily with supper. 30 tablet 3  . tamsulosin (FLOMAX) 0.4 MG CAPS capsule Take 0.4 mg by mouth daily.    . flecainide (TAMBOCOR) 50 MG tablet Take 1 tablet (50 mg total) by mouth 2 (two) times daily. 60 tablet 3   No current facility-administered medications for this encounter.    No Known Allergies  Social History   Socioeconomic History  . Marital status: Married    Spouse name: Not on file  . Number of children: Not on file  . Years of education: Not on file  . Highest education level: Not on file  Occupational History  . Not on file  Tobacco Use  . Smoking status: Never Smoker  . Smokeless tobacco: Never Used  Substance and Sexual Activity  . Alcohol use:  Not Currently    Alcohol/week: 2.0 standard drinks    Types: 2 Cans of beer per week    Comment: occasional/social  . Drug use: Never  . Sexual activity: Not on file  Other Topics Concern  . Not on file  Social History Narrative  . Not on file   Social Determinants of Health   Financial Resource Strain:    . Difficulty of Paying Living Expenses:   Food Insecurity:   . Worried About Programme researcher, broadcasting/film/video in the Last Year:   . Barista in the Last Year:   Transportation Needs:   . Freight forwarder (Medical):   Marland Kitchen Lack of Transportation (Non-Medical):   Physical Activity:   . Days of Exercise per Week:   . Minutes of Exercise per Session:   Stress:   . Feeling of Stress :   Social Connections:   . Frequency of Communication with Friends and Family:   . Frequency of Social Gatherings with Friends and Family:   . Attends Religious Services:   . Active Member of Clubs or Organizations:   . Attends Banker Meetings:   Marland Kitchen Marital Status:   Intimate Partner Violence:   . Fear of Current or Ex-Partner:   . Emotionally Abused:   Marland Kitchen Physically Abused:   . Sexually Abused:      ROS- All systems are reviewed and negative except as per the HPI above.  Physical Exam: Vitals:   08/10/19 0943  BP: 140/90  Pulse: 82  Weight: 92.6 kg  Height: 6\' 2"  (1.88 m)    GEN- The patient is well appearing, alert and oriented x 3 today.   HEENT-head normocephalic, atraumatic, sclera clear, conjunctiva pink, hearing intact, trachea midline. Lungs- Clear to ausculation bilaterally, normal work of breathing Heart- irregular rate and rhythm, no murmurs, rubs or gallops  GI- soft, NT, ND, + BS Extremities- no clubbing, cyanosis, or edema MS- no significant deformity or atrophy Skin- no rash or lesion Psych- euthymic mood, full affect Neuro- strength and sensation are intact   Wt Readings from Last 3 Encounters:  08/10/19 92.6 kg  08/08/19 93 kg  07/31/19 92.9 kg    EKG today demonstrates afib HR 82, QRS 96, QTc 460  Echo 06/08/19 demonstrated  1. Left ventricular ejection fraction, by estimation, is 60 to 65%. The  left ventricle has normal function. The left ventricle has no regional  wall motion abnormalities. LV diastolic filing cannot be assessed due to  underlying  atrial fibrillation.  2. Right ventricular systolic function is normal. The right ventricular  size is normal. Tricuspid regurgitation signal is inadequate for assessing  PA pressure.  3. The mitral valve is normal in structure and function. No evidence of  mitral valve regurgitation. No evidence of mitral stenosis.  4. The aortic valve is tricuspid. Aortic valve regurgitation is not  visualized. Mild aortic valve sclerosis is present, with no evidence of  aortic valve stenosis.  5. The inferior vena cava is dilated in size with >50% respiratory  variability, suggesting right atrial pressure of 8 mmHg.   Epic records are reviewed at length today  CHA2DS2-VASc Score = 0 The patient's score is based upon: CHF History: No HTN History: No Age : < 65 Diabetes History: No Stroke History: No Vascular Disease History: No Gender: Male   ASSESSMENT AND PLAN: 1. Persistent Atrial Fibrillation (ICD10:  I48.19) The patient's CHA2DS2-VASc score is 0, indicating a 0.2% annual risk of stroke.  S/p unsuccessful DCCV 08/08/19. We discussed AAD therapy today including flecainide and Multaq. Will plan to start flecainide 50 mg BID. If he remains in afib on follow up, will increase dose and plan for repeat DCCV. Continue Xarelto 20 mg daily.  Continue Toprol 12.5 mg daily.  2. OSA The importance of adequate treatment of sleep apnea was discussed today in order to improve our ability to maintain sinus rhythm long term. Followed by Dr Earl Gala.  Patient reports compliance with CPAP therapy.    Follow up next week for ECG.   [name] PA-C Afib [facility] [address] [place], [place] [address] [phone] 08/10/2019 10:31 AM

## 2019-08-10 NOTE — Patient Instructions (Signed)
Start Flecainide 50mg twice a day 

## 2019-08-15 ENCOUNTER — Other Ambulatory Visit: Payer: Self-pay

## 2019-08-15 ENCOUNTER — Ambulatory Visit (HOSPITAL_COMMUNITY)
Admission: RE | Admit: 2019-08-15 | Discharge: 2019-08-15 | Disposition: A | Discharge status: Home / Self Care | Payer: 59 | Source: Ambulatory Visit | Attending: Physician Assistant | Admitting: Physician Assistant

## 2019-08-15 ENCOUNTER — Ambulatory Visit (HOSPITAL_COMMUNITY): Payer: 59 | Admitting: Physician Assistant

## 2019-08-15 VITALS — BP 128/80 | HR 77

## 2019-08-15 DIAGNOSIS — I4891 Unspecified atrial fibrillation: Secondary | ICD-10-CM | POA: Insufficient documentation

## 2019-08-15 DIAGNOSIS — Z79899 Other long term (current) drug therapy: Secondary | ICD-10-CM | POA: Diagnosis not present

## 2019-08-15 DIAGNOSIS — I4819 Other persistent atrial fibrillation: Secondary | ICD-10-CM

## 2019-08-15 MED ORDER — FLECAINIDE ACETATE 50 MG PO TABS
100.0000 mg | ORAL_TABLET | Freq: Two times a day (BID) | ORAL | 3 refills | Status: DC
Start: 2019-08-15 — End: 2019-09-12

## 2019-08-15 NOTE — Patient Instructions (Signed)
Increase flecainide to 100mg twice a day 

## 2019-08-15 NOTE — Progress Notes (Signed)
Patient returns for ECG after starting flecainide. ECG shows afib HR 77, QRS 108, QTc 450. Will increase flecainide to 100 mg BID. If he does not convert with medication, will plan for repeat DCCV. F/u in one week for ECG.

## 2019-08-21 ENCOUNTER — Ambulatory Visit (HOSPITAL_COMMUNITY)
Admission: RE | Admit: 2019-08-21 | Discharge: 2019-08-21 | Disposition: A | Discharge status: Home / Self Care | Payer: 59 | Source: Ambulatory Visit | Attending: Physician Assistant | Admitting: Physician Assistant

## 2019-08-21 ENCOUNTER — Other Ambulatory Visit: Payer: Self-pay

## 2019-08-21 VITALS — BP 108/70 | HR 77

## 2019-08-21 DIAGNOSIS — I4891 Unspecified atrial fibrillation: Secondary | ICD-10-CM | POA: Diagnosis present

## 2019-08-21 DIAGNOSIS — Z79899 Other long term (current) drug therapy: Secondary | ICD-10-CM | POA: Diagnosis not present

## 2019-08-21 DIAGNOSIS — I484 Atypical atrial flutter: Secondary | ICD-10-CM | POA: Insufficient documentation

## 2019-08-21 LAB — BASIC METABOLIC PANEL
Anion gap: 11 (ref 5–15)
BUN: 15 mg/dL (ref 6–20)
CO2: 24 mmol/L (ref 22–32)
Calcium: 9.5 mg/dL (ref 8.9–10.3)
Chloride: 106 mmol/L (ref 98–111)
Creatinine, Ser: 1.17 mg/dL (ref 0.61–1.24)
GFR calc Af Amer: >60 mL/min (ref >60)
GFR calc non Af Amer: >60 mL/min (ref >60)
Glucose, Bld: 106 mg/dL — ABNORMAL HIGH (ref 70–99)
Potassium: 5.2 mmol/L — ABNORMAL HIGH (ref 3.5–5.1)
Sodium: 141 mmol/L (ref 135–145)

## 2019-08-21 LAB — CBC
HCT: 48.2 % (ref 39.0–52.0)
Hemoglobin: 15.6 g/dL (ref 13.0–17.0)
MCH: 28.4 pg (ref 26.0–34.0)
MCHC: 32.4 g/dL (ref 30.0–36.0)
MCV: 87.8 fL (ref 80.0–100.0)
Platelets: 260 10*3/uL (ref 150–400)
RBC: 5.49 MIL/uL (ref 4.22–5.81)
RDW: 12.3 % (ref 11.5–15.5)
WBC: 6.4 10*3/uL (ref 4.0–10.5)
nRBC: 0 % (ref 0.0–0.2)

## 2019-08-21 NOTE — Progress Notes (Signed)
Patient returns for ECG after increasing flecainide. ECG shows atrial flutter HR 77 with variable block, QRS 108, QTc 468. Patient tolerating the medication without difficulty. Will plan for repeat DCCV. Check bmet/CBC today. F/u in AF clinic one week post DCCV.

## 2019-08-21 NOTE — Patient Instructions (Signed)
Cardioversion scheduled for Wednesday, May 19th  - Arrive at the [facility] and go to admitting at 9:00AM  -Do not eat or drink anything after midnight the night prior to your procedure.  - Take all your morning medication with a sip of water prior to arrival.  - You will not be able to drive home after your procedure.

## 2019-08-24 ENCOUNTER — Encounter (HOSPITAL_BASED_OUTPATIENT_CLINIC_OR_DEPARTMENT_OTHER): Payer: 59 | Admitting: Cardiology

## 2019-08-28 ENCOUNTER — Other Ambulatory Visit (HOSPITAL_COMMUNITY)
Admission: RE | Admit: 2019-08-28 | Discharge: 2019-08-28 | Disposition: A | Discharge status: Home / Self Care | Payer: 59 | Source: Ambulatory Visit | Attending: Cardiovascular Disease | Admitting: Cardiovascular Disease

## 2019-08-28 DIAGNOSIS — Z20822 Contact with and (suspected) exposure to covid-19: Secondary | ICD-10-CM | POA: Diagnosis not present

## 2019-08-28 DIAGNOSIS — Z01812 Encounter for preprocedural laboratory examination: Secondary | ICD-10-CM | POA: Insufficient documentation

## 2019-08-28 LAB — SARS CORONAVIRUS 2 (TAT 6-24 HRS): SARS Coronavirus 2: NEGATIVE

## 2019-08-29 ENCOUNTER — Encounter (HOSPITAL_COMMUNITY): Payer: Self-pay | Admitting: Anesthesiology

## 2019-08-29 NOTE — Anesthesia Preprocedure Evaluation (Deleted)
Anesthesia Evaluation    Reviewed: Allergy & Precautions, H&P , Patient's Chart, lab work & pertinent test results  Airway        Dental   Pulmonary neg pulmonary ROS, sleep apnea ,           Cardiovascular Exercise Tolerance: Good negative cardio ROS  + dysrhythmias Atrial Fibrillation      Neuro/Psych negative neurological ROS  negative psych ROS   GI/Hepatic negative GI ROS, Neg liver ROS,   Endo/Other  negative endocrine ROS  Renal/GU negative Renal ROS  negative genitourinary   Musculoskeletal   Abdominal   Peds  Hematology negative hematology ROS (+)   Anesthesia Other Findings   Reproductive/Obstetrics negative OB ROS                             Anesthesia Physical Anesthesia Plan  ASA: III  Anesthesia Plan: General   Post-op Pain Management:    Induction: Intravenous  PONV Risk Score and Plan: 2 and Propofol infusion and Treatment may vary due to age or medical condition  Airway Management Planned: Mask  Additional Equipment:   Intra-op Plan:   Post-operative Plan:   Informed Consent: I have reviewed the patients History and Physical, chart, labs and discussed the procedure including the risks, benefits and alternatives for the proposed anesthesia with the patient or authorized representative who has indicated his/her understanding and acceptance.     Dental advisory given  Plan Discussed with: CRNA  Anesthesia Plan Comments:         Anesthesia Quick Evaluation

## 2019-08-30 ENCOUNTER — Ambulatory Visit (HOSPITAL_COMMUNITY)
Admission: RE | Admit: 2019-08-30 | Discharge: 2019-08-30 | Disposition: A | Discharge status: Home / Self Care | Payer: 59 | Source: Ambulatory Visit | Attending: Cardiovascular Disease | Admitting: Cardiovascular Disease

## 2019-08-30 ENCOUNTER — Encounter (HOSPITAL_COMMUNITY)
Admission: RE | Disposition: A | Discharge status: Home / Self Care | Payer: Self-pay | Source: Ambulatory Visit | Attending: Cardiovascular Disease

## 2019-08-30 ENCOUNTER — Encounter (HOSPITAL_COMMUNITY): Admission: RE | Payer: Self-pay | Source: Home / Self Care

## 2019-08-30 ENCOUNTER — Ambulatory Visit (HOSPITAL_COMMUNITY): Admission: RE | Admit: 2019-08-30 | Payer: 59 | Source: Home / Self Care | Admitting: Cardiovascular Disease

## 2019-08-30 ENCOUNTER — Encounter (HOSPITAL_COMMUNITY): Payer: Self-pay | Admitting: Anesthesiology

## 2019-08-30 ENCOUNTER — Encounter (HOSPITAL_COMMUNITY): Payer: Self-pay | Admitting: Cardiovascular Disease

## 2019-08-30 DIAGNOSIS — Z79899 Other long term (current) drug therapy: Secondary | ICD-10-CM | POA: Diagnosis not present

## 2019-08-30 DIAGNOSIS — G4733 Obstructive sleep apnea (adult) (pediatric): Secondary | ICD-10-CM | POA: Diagnosis not present

## 2019-08-30 DIAGNOSIS — I4891 Unspecified atrial fibrillation: Secondary | ICD-10-CM | POA: Diagnosis not present

## 2019-08-30 DIAGNOSIS — I480 Paroxysmal atrial fibrillation: Secondary | ICD-10-CM

## 2019-08-30 DIAGNOSIS — Z87442 Personal history of urinary calculi: Secondary | ICD-10-CM | POA: Diagnosis not present

## 2019-08-30 DIAGNOSIS — Z538 Procedure and treatment not carried out for other reasons: Secondary | ICD-10-CM | POA: Insufficient documentation

## 2019-08-30 SURGERY — CARDIOVERSION
Anesthesia: General

## 2019-08-30 SURGERY — CANCELLED PROCEDURE

## 2019-08-30 NOTE — H&P (Signed)
Mr. Puthoff arrived for cardioversion. He is in normal sinus rhythm. DCCV canceled. We will notify Afib clinic.   Gerri Spore T. Flora Lipps, MD [facility]  [address] [place], [place] [address] [phone]  9:24 AM

## 2019-08-30 NOTE — Anesthesia Preprocedure Evaluation (Deleted)
Anesthesia Evaluation  Patient identified by MRN, date of birth, ID band Patient awake    Reviewed: Allergy & Precautions, H&P , NPO status , Patient's Chart, lab work & pertinent test results, reviewed documented beta blocker date and time   Airway        Dental no notable dental hx.    Pulmonary sleep apnea ,    Pulmonary exam normal        Cardiovascular + dysrhythmias Atrial Fibrillation      Neuro/Psych negative neurological ROS  negative psych ROS   GI/Hepatic negative GI ROS, Neg liver ROS,   Endo/Other  negative endocrine ROS  Renal/GU negative Renal ROS  negative genitourinary   Musculoskeletal   Abdominal   Peds  Hematology negative hematology ROS (+)   Anesthesia Other Findings   Reproductive/Obstetrics negative OB ROS                             Anesthesia Physical Anesthesia Plan  ASA: III  Anesthesia Plan: General   Post-op Pain Management:    Induction: Intravenous  PONV Risk Score and Plan: 2 and Propofol infusion and Treatment may vary due to age or medical condition  Airway Management Planned: Mask  Additional Equipment:   Intra-op Plan:   Post-operative Plan:   Informed Consent: I have reviewed the patients History and Physical, chart, labs and discussed the procedure including the risks, benefits and alternatives for the proposed anesthesia with the patient or authorized representative who has indicated his/her understanding and acceptance.     Dental advisory given  Plan Discussed with: CRNA  Anesthesia Plan Comments: (Case cancelled due to patient in NSR.)       Anesthesia Quick Evaluation

## 2019-08-30 NOTE — Care Plan (Signed)
On arrival patient noted to be in NSR , SR confirmed with ECG. Dr.O'Neal aware and visited with patient. Patient discharged to home.

## 2019-09-05 NOTE — Progress Notes (Signed)
Primary Care Physician: Orpah Melter, MD Primary Cardiologist: none Primary Electrophysiologist: none Referring Physician: Dr Collins [name] is a 54 y.o. male with a history of nephrolithiasis, OSA, and new onset atrial fibrillation who presents for follow up in the [facility].  The patient was initially diagnosed with atrial fibrillation 06/08/19 after presenting for a ESWL for kidney stones. Afib with RVR noted incidentally and he was asymptomatic. Patient is not on anticoagulation with a CHADS2VASC score of 0. He was started on metoprolol and discharged in rate controlled afib. Patient reports that he may feel a "flutter" occasionally but he denies any other symptoms. He denies significant alcohol use. Patient is s/p unsuccessful DCCV on 08/08/19. Patient has started using his CPAP machine.   On follow up today, patient presented for DCCV on 08/30/19 and was found to be in SR after increasing flecainide. Unfortunately, he is back in rate controlled atrial flutter today. He does have increased anxiousness when he is out of rhythm.    Today, he denies symptoms of palpitations, chest pain, shortness of breath, orthopnea, PND, lower extremity edema, dizziness, presyncope, syncope, snoring, daytime somnolence, bleeding, or neurologic sequela. The patient is tolerating medications without difficulties and is otherwise without complaint today.    Atrial Fibrillation Risk Factors:  he does have symptoms or diagnosis of sleep apnea.  He is compliant with CPAP therapy. he does not have a history of rheumatic fever. he does not have a history of alcohol use. The patient does not have a history of early familial atrial fibrillation or other arrhythmias.  he has a BMI of Body mass index is 26.35 kg/m.Marland Kitchen Filed Weights   09/06/19 0941  Weight: 93.1 kg    No family history on file.   Atrial Fibrillation Management history:  Previous antiarrhythmic drugs:  flecainide Previous cardioversions: 08/08/19 Previous ablations: none CHADS2VASC score: 0 Anticoagulation history: Xarelto   Past Medical History:  Diagnosis Date  . Sleep apnea    Past Surgical History:  Procedure Laterality Date  . CARDIOVERSION N/A 08/08/2019   Procedure: CARDIOVERSION;  Surgeon: [name], MD;  Location: [facility];  Service: Cardiovascular;  Laterality: N/A;  . EXTRACORPOREAL SHOCK WAVE LITHOTRIPSY Left 06/08/2019   Procedure: EXTRACORPOREAL SHOCK WAVE LITHOTRIPSY (ESWL);  Surgeon: [name], MD;  Location: [facility];  Service: Urology;  Laterality: Left;    Current Outpatient Medications  Medication Sig Dispense Refill  . Ascorbic Acid (VITAMIN C) 1000 MG tablet Take 1,000 mg by mouth daily as needed (immune support).    . flecainide (TAMBOCOR) 50 MG tablet Take 2 tablets (100 mg total) by mouth 2 (two) times daily. 60 tablet 3  . metoprolol succinate (TOPROL-XL) 25 MG 24 hr tablet Take 0.5 tablets (12.5 mg total) by mouth daily. (Patient taking differently: Take 12.5 mg by mouth every evening. ) 15 tablet 3  . prednisoLONE acetate (PRED FORTE) 1 % ophthalmic suspension Place 1 drop into the left eye daily as needed (redness and swelling).     . rivaroxaban (XARELTO) 20 MG TABS tablet Take 1 tablet (20 mg total) by mouth daily with supper. 30 tablet 3  . tamsulosin (FLOMAX) 0.4 MG CAPS capsule Take 0.4 mg by mouth daily with supper.     . zinc gluconate 50 MG tablet Take 50 mg by mouth daily as needed (immune support).     No current facility-administered medications for this encounter.    No Known Allergies  Social  History   Socioeconomic History  . Marital status: Married    Spouse name: Not on file  . Number of children: Not on file  . Years of education: Not on file  . Highest education level: Not on file  Occupational History  . Not on file  Tobacco Use  . Smoking status: Never Smoker  . Smokeless tobacco: Never  Used  Substance and Sexual Activity  . Alcohol use: Not Currently    Alcohol/week: 2.0 standard drinks    Types: 2 Cans of beer per week    Comment: occasional/social  . Drug use: Never  . Sexual activity: Not on file  Other Topics Concern  . Not on file  Social History Narrative  . Not on file   Social Determinants of Health   Financial Resource Strain:   . Difficulty of Paying Living Expenses:   Food Insecurity:   . Worried About Programme researcher, broadcasting/film/video in the Last Year:   . Barista in the Last Year:   Transportation Needs:   . Freight forwarder (Medical):   Marland Kitchen Lack of Transportation (Non-Medical):   Physical Activity:   . Days of Exercise per Week:   . Minutes of Exercise per Session:   Stress:   . Feeling of Stress :   Social Connections:   . Frequency of Communication with Friends and Family:   . Frequency of Social Gatherings with Friends and Family:   . Attends Religious Services:   . Active Member of Clubs or Organizations:   . Attends Banker Meetings:   Marland Kitchen Marital Status:   Intimate Partner Violence:   . Fear of Current or Ex-Partner:   . Emotionally Abused:   Marland Kitchen Physically Abused:   . Sexually Abused:      ROS- All systems are reviewed and negative except as per the HPI above.  Physical Exam: Vitals:   09/06/19 0941  BP: 118/80  Pulse: 72  Weight: 93.1 kg  Height: 6\' 2"  (1.88 m)    GEN- The patient is well appearing, alert and oriented x 3 today.   HEENT-head normocephalic, atraumatic, sclera clear, conjunctiva pink, hearing intact, trachea midline. Lungs- Clear to ausculation bilaterally, normal work of breathing Heart- irregular rate and rhythm, no murmurs, rubs or gallops  GI- soft, NT, ND, + BS Extremities- no clubbing, cyanosis, or edema MS- no significant deformity or atrophy Skin- no rash or lesion Psych- euthymic mood, full affect Neuro- strength and sensation are intact   Wt Readings from Last 3 Encounters:   09/06/19 93.1 kg  08/10/19 92.6 kg  08/08/19 93 kg    EKG today demonstrates atrial flutter with variable block HR 72, LPFB, QRS 110, QTc 442  Echo 06/08/19 demonstrated  1. Left ventricular ejection fraction, by estimation, is 60 to 65%. The  left ventricle has normal function. The left ventricle has no regional  wall motion abnormalities. LV diastolic filing cannot be assessed due to  underlying atrial fibrillation.  2. Right ventricular systolic function is normal. The right ventricular  size is normal. Tricuspid regurgitation signal is inadequate for assessing  PA pressure.  3. The mitral valve is normal in structure and function. No evidence of  mitral valve regurgitation. No evidence of mitral stenosis.  4. The aortic valve is tricuspid. Aortic valve regurgitation is not  visualized. Mild aortic valve sclerosis is present, with no evidence of  aortic valve stenosis.  5. The inferior vena cava is dilated in size with >  50% respiratory  variability, suggesting right atrial pressure of 8 mmHg.   Epic records are reviewed at length today  CHA2DS2-VASc Score = 0 The patient's score is based upon: CHF History: No HTN History: No Age : < 65 Diabetes History: No Stroke History: No Vascular Disease History: No Gender: Male   ASSESSMENT AND PLAN: 1. Persistent Atrial Fibrillation/atrial flutter  The patient's CHA2DS2-VASc score is 0, indicating a 0.2% annual risk of stroke.   Patient presented for DCCV and was in SR, in atrial flutter today. We discussed therapeutic options today including ablation vs dofetilide. Patient agreeable to EP consultation.  Continue flecainide 100 mg BID Continue Xarelto 20 mg daily for now. Continue Toprol 12.5 mg daily.  2. OSA Followed by Dr Earl Gala.  Patient reports compliance with CPAP therapy.   Follow up with EP to discuss ablation vs dofetilide.    [name] PA-C [facility] [address]  [place], [place] [address] [phone] 09/06/2019 10:23 AM

## 2019-09-06 ENCOUNTER — Encounter (HOSPITAL_COMMUNITY): Payer: Self-pay | Admitting: Physician Assistant

## 2019-09-06 ENCOUNTER — Other Ambulatory Visit: Payer: Self-pay

## 2019-09-06 ENCOUNTER — Ambulatory Visit (HOSPITAL_COMMUNITY)
Admission: RE | Admit: 2019-09-06 | Discharge: 2019-09-06 | Disposition: A | Discharge status: Home / Self Care | Payer: 59 | Source: Ambulatory Visit | Attending: Physician Assistant | Admitting: Physician Assistant

## 2019-09-06 VITALS — BP 118/80 | HR 72 | Ht 74.0 in | Wt 205.2 lb

## 2019-09-06 DIAGNOSIS — G4733 Obstructive sleep apnea (adult) (pediatric): Secondary | ICD-10-CM | POA: Insufficient documentation

## 2019-09-06 DIAGNOSIS — Z79899 Other long term (current) drug therapy: Secondary | ICD-10-CM | POA: Insufficient documentation

## 2019-09-06 DIAGNOSIS — Z7901 Long term (current) use of anticoagulants: Secondary | ICD-10-CM | POA: Diagnosis not present

## 2019-09-06 DIAGNOSIS — I4892 Unspecified atrial flutter: Secondary | ICD-10-CM | POA: Insufficient documentation

## 2019-09-06 DIAGNOSIS — Z87442 Personal history of urinary calculi: Secondary | ICD-10-CM | POA: Insufficient documentation

## 2019-09-06 DIAGNOSIS — I483 Typical atrial flutter: Secondary | ICD-10-CM | POA: Diagnosis not present

## 2019-09-06 DIAGNOSIS — Z9989 Dependence on other enabling machines and devices: Secondary | ICD-10-CM | POA: Diagnosis not present

## 2019-09-06 DIAGNOSIS — I4819 Other persistent atrial fibrillation: Secondary | ICD-10-CM | POA: Diagnosis present

## 2019-09-06 NOTE — Addendum Note (Signed)
Encounter addended by: Shona Simpson, RN on: 09/06/2019 10:26 AM  Actions taken: Order list changed, Diagnosis association updated

## 2019-09-12 ENCOUNTER — Other Ambulatory Visit (HOSPITAL_COMMUNITY): Payer: Self-pay | Admitting: *Deleted

## 2019-09-12 MED ORDER — FLECAINIDE ACETATE 100 MG PO TABS
100.0000 mg | ORAL_TABLET | Freq: Two times a day (BID) | ORAL | 3 refills | Status: DC
Start: 2019-09-12 — End: 2020-01-08

## 2019-09-19 ENCOUNTER — Telehealth: Payer: Self-pay

## 2019-09-19 NOTE — Telephone Encounter (Signed)
Left a message regarding virtual visit on 09/20/19.

## 2019-09-20 ENCOUNTER — Encounter: Payer: Self-pay | Admitting: Internal Medicine

## 2019-09-20 ENCOUNTER — Telehealth (INDEPENDENT_AMBULATORY_CARE_PROVIDER_SITE_OTHER): Payer: 59 | Admitting: Internal Medicine

## 2019-09-20 ENCOUNTER — Other Ambulatory Visit: Payer: Self-pay

## 2019-09-20 VITALS — Ht 74.0 in | Wt 200.0 lb

## 2019-09-20 DIAGNOSIS — D6869 Other thrombophilia: Secondary | ICD-10-CM

## 2019-09-20 DIAGNOSIS — G4733 Obstructive sleep apnea (adult) (pediatric): Secondary | ICD-10-CM | POA: Diagnosis not present

## 2019-09-20 DIAGNOSIS — I4819 Other persistent atrial fibrillation: Secondary | ICD-10-CM

## 2019-09-20 NOTE — Progress Notes (Signed)
Electrophysiology TeleHealth Note   Due to national recommendations of social distancing due to COVID 19, Audio telehealth visit is felt to be most appropriate for this patient at this time.  See MyChart message from today for patient consent regarding telehealth for [facility].   Date:  09/20/2019   ID:  [name], DOB Nov 25, 1965, MRN [id number]  Location: home  Provider location: [facility] Evaluation Performed: New patient consult  PCP:  Joycelyn Rua, MD  Cardiologist:  No primary care provider on file.   Electrophysiologist:  None   Chief Complaint:  afib  History of Present Illness:    [name] is a 54 y.o. male who presents via audio conferencing for a telehealth visit today.   The patient is referred for new consultation regarding afib by [name].  He reports initially being diagnosed with atrial fibrillation 06/08/19 after presenting with renal stones.  He was noted to be asymptomatic but in afib with RVR at that time.  He was started on xarelto and underwent cardioversion 08/08/19.  This was not successful.  He was placed on flecainide and maintained sinus initially.  Unfortunately, he returned to afib.  He was diagnosed with sleep apnea and now uses CPAP. He has obtained an apple watch and feels that occasionally he may be in sinus rhythm. He says that during afib, he has symptoms of fatigue and decreased exercise tolerance. Though he doesn't notice as much during activity, he is more tired afterwards.  He feels more "at ease" in sinus.  He has occasional "fluttering" with atrial fib. Today, he denies symptoms of chest pain, shortness of breath, orthopnea, PND, lower extremity edema, claudication, dizziness, presyncope, syncope, bleeding, or neurologic sequela. The patient is tolerating medications without difficulties and is otherwise without complaint today.     Past Medical History:  Diagnosis Date   Persistent atrial fibrillation (HCC)     Renal stones    Sleep apnea    uses CPAP    Past Surgical History:  Procedure Laterality Date   CARDIOVERSION N/A 08/08/2019   Procedure: CARDIOVERSION;  Surgeon: Chilton Si, MD;  Location: [facility];  Service: Cardiovascular;  Laterality: N/A;   EXTRACORPOREAL SHOCK WAVE LITHOTRIPSY Left 06/08/2019   Procedure: EXTRACORPOREAL SHOCK WAVE LITHOTRIPSY (ESWL);  Surgeon: Noel Christmas, MD;  Location[facility];  Service: Urology;  Laterality: Left;    Current Outpatient Medications  Medication Sig Dispense Refill   Ascorbic Acid (VITAMIN C) 1000 MG tablet Take 1,000 mg by mouth daily as needed (immune support).     flecainide (TAMBOCOR) 100 MG tablet Take 1 tablet (100 mg total) by mouth 2 (two) times daily. 60 tablet 3   prednisoLONE acetate (PRED FORTE) 1 % ophthalmic suspension Place 1 drop into the left eye daily as needed (redness and swelling).      rivaroxaban (XARELTO) 20 MG TABS tablet Take 1 tablet (20 mg total) by mouth daily with supper. 30 tablet 3   tamsulosin (FLOMAX) 0.4 MG CAPS capsule Take 0.4 mg by mouth daily with supper.      zinc gluconate 50 MG tablet Take 50 mg by mouth daily as needed (immune support).     metoprolol succinate (TOPROL-XL) 25 MG 24 hr tablet Take 0.5 tablets (12.5 mg total) by mouth daily. (Patient taking differently: Take 12.5 mg by mouth every evening. ) 15 tablet 3   No current facility-administered medications for this visit.    Allergies:   Patient has  no known allergies.   Social History:  The patient  reports that he has never smoked. He has never used smokeless tobacco. He reports previous alcohol use of about 2.0 standard drinks of alcohol per week. He reports that he does not use drugs.   Family History:  The patient's family history includes Hypertension in his mother.    ROS:  Please see the history of present illness.   All other systems are personally reviewed and negative.    Exam:     Vital Signs:  Ht 6\' 2"  (1.88 m)    Wt 200 lb (90.7 kg)    BMI 25.68 kg/m    Well sounding, alert and conversant    Labs/Other Tests and Data Reviewed:    Recent Labs: 06/08/2019: ALT 19; Magnesium 2.3; TSH 2.499 08/21/2019: BUN 15; Creatinine, Ser 1.17; Hemoglobin 15.6; Platelets 260; Potassium 5.2; Sodium 141   Wt Readings from Last 3 Encounters:  09/20/19 200 lb (90.7 kg)  09/06/19 205 lb 3.2 oz (93.1 kg)  08/10/19 204 lb 3.2 oz (92.6 kg)     Other studies personally reviewed: Additional studies/ records that were reviewed today include: AF clinic notes,  Prior echo  Review of the above records today demonstrates: as above   ASSESSMENT & PLAN:    1.  Persistent afib/ atrial flutter The patient has symptomatic, recurrent persistent atrial fibrillation.  Adline Peals states that the patient was in atrial flutter 09/06/19 however on my review this appears to be more likely coarse atrial fib he has failed medical therapy with flecainide. Chads2vasc score is 0.  he is anticoagulated with xarelto . Therapeutic strategies for afib including medicine and ablation were discussed in detail with the patient today. Risk, benefits, and alternatives to EP study and radiofrequency ablation for afib were also discussed in detail today. These risks include but are not limited to stroke, bleeding, vascular damage, tamponade, perforation, damage to the esophagus, lungs, and other structures, pulmonary vein stenosis, worsening renal function, and death. The patient understands these risk and wishes to think about this further.  I answered a number of questions today.  If he decides to proceed, he will contact my office.  We would plan cardiac CT prior to ablation. He will continue his current medicines at this time.  2. OSA Compliance with CPAP advised  Follow-up:  AF clinic in 3 months  Patient Risk:  after full review of this patients clinical status, I feel that they are at moderate risk at  this time.   Today, I have spent 35 minutes with the patient with telehealth technology discussing afib .    Signed, Thompson Grayer MD, [facility] 09/20/2019 3:17 PM   [place] [address] [phone] (office) [phone] (fax)

## 2019-09-27 ENCOUNTER — Other Ambulatory Visit (HOSPITAL_COMMUNITY): Payer: Self-pay | Admitting: *Deleted

## 2019-09-27 MED ORDER — RIVAROXABAN 20 MG PO TABS: 20.0000 mg | ORAL_TABLET | Freq: Every day | ORAL | 5 refills | Status: DC

## 2019-11-03 ENCOUNTER — Other Ambulatory Visit (HOSPITAL_COMMUNITY): Payer: Self-pay | Admitting: Physician Assistant

## 2019-11-15 ENCOUNTER — Other Ambulatory Visit: Payer: Self-pay

## 2019-11-15 ENCOUNTER — Ambulatory Visit (HOSPITAL_COMMUNITY)
Admission: RE | Admit: 2019-11-15 | Discharge: 2019-11-15 | Disposition: A | Discharge status: Home / Self Care | Payer: 59 | Source: Ambulatory Visit | Attending: Physician Assistant | Admitting: Physician Assistant

## 2019-11-15 ENCOUNTER — Encounter (HOSPITAL_COMMUNITY): Payer: Self-pay | Admitting: Physician Assistant

## 2019-11-15 VITALS — BP 130/80 | HR 60 | Ht 74.0 in | Wt 213.4 lb

## 2019-11-15 DIAGNOSIS — G4733 Obstructive sleep apnea (adult) (pediatric): Secondary | ICD-10-CM | POA: Diagnosis not present

## 2019-11-15 DIAGNOSIS — I4892 Unspecified atrial flutter: Secondary | ICD-10-CM | POA: Insufficient documentation

## 2019-11-15 DIAGNOSIS — Z79899 Other long term (current) drug therapy: Secondary | ICD-10-CM | POA: Diagnosis not present

## 2019-11-15 DIAGNOSIS — Z7901 Long term (current) use of anticoagulants: Secondary | ICD-10-CM | POA: Insufficient documentation

## 2019-11-15 DIAGNOSIS — I4819 Other persistent atrial fibrillation: Secondary | ICD-10-CM | POA: Diagnosis present

## 2019-11-15 DIAGNOSIS — Z8249 Family history of ischemic heart disease and other diseases of the circulatory system: Secondary | ICD-10-CM | POA: Insufficient documentation

## 2019-11-15 DIAGNOSIS — Z87442 Personal history of urinary calculi: Secondary | ICD-10-CM | POA: Diagnosis not present

## 2019-11-15 NOTE — Progress Notes (Addendum)
Primary Care Physician: Joycelyn Rua, MD Primary Cardiologist: none Primary Electrophysiologist: Dr Johney Frame Referring Physician: Dr Boris Sharper is a 54 y.o. male with a history of nephrolithiasis, OSA, and new onset atrial fibrillation who presents for follow up in the [facility].  The patient was initially diagnosed with atrial fibrillation 06/08/19 after presenting for a ESWL for kidney stones. Afib with RVR noted incidentally and he was asymptomatic. Patient is not on anticoagulation with a CHADS2VASC score of 0. He was started on metoprolol and discharged in rate controlled afib. Patient reports that he may feel a "flutter" occasionally but he denies any other symptoms. He denies significant alcohol use. Patient is s/p unsuccessful DCCV on 08/08/19. Patient has started using his CPAP machine. Patient presented for DCCV on 08/30/19 and was found to be in SR after increasing flecainide. Unfortunately, he was back in rate controlled atrial flutter on follow up.  On follow up today, patient reports he has done reasonably well since his last visit. He has a smart watch which shows that he has extended episodes at least a couple days per week. He reports that he "just feels bad" on days he has afib. He denies any missed doses of anticoagulation. He has been compliant with CPAP therapy.  Today, he denies symptoms of palpitations, chest pain, shortness of breath, orthopnea, PND, lower extremity edema, dizziness, presyncope, syncope, snoring, daytime somnolence, bleeding, or neurologic sequela. The patient is tolerating medications without difficulties and is otherwise without complaint today.    Atrial Fibrillation Risk Factors:  he does have symptoms or diagnosis of sleep apnea.  He is compliant with CPAP therapy. he does not have a history of rheumatic fever. he does not have a history of alcohol use. The patient does not have a history of early familial  atrial fibrillation or other arrhythmias.  he has a BMI of Body mass index is 27.4 kg/m.Marland Kitchen Filed Weights   11/15/19 0843  Weight: 96.8 kg    Family History  Problem Relation Age of Onset  . Hypertension Mother      Atrial Fibrillation Management history:  Previous antiarrhythmic drugs: flecainide Previous cardioversions: 08/08/19 Previous ablations: none CHADS2VASC score: 0 Anticoagulation history: Xarelto   Past Medical History:  Diagnosis Date  . Persistent atrial fibrillation (HCC)   . Renal stones   . Sleep apnea    uses CPAP   Past Surgical History:  Procedure Laterality Date  . CARDIOVERSION N/A 08/08/2019   Procedure: CARDIOVERSION;  Surgeon: Chilton Si, MD;  Location: [facility] ENDOSCOPY;  Service: Cardiovascular;  Laterality: N/A;  . EXTRACORPOREAL SHOCK WAVE LITHOTRIPSY Left 06/08/2019   Procedure: EXTRACORPOREAL SHOCK WAVE LITHOTRIPSY (ESWL);  Surgeon: Noel Christmas, MD;  [facility];  Service: Urology;  Laterality: Left;    Current Outpatient Medications  Medication Sig Dispense Refill  . Ascorbic Acid (VITAMIN C) 1000 MG tablet Take 1,000 mg by mouth daily as needed (immune support).    . flecainide (TAMBOCOR) 100 MG tablet Take 1 tablet (100 mg total) by mouth 2 (two) times daily. 60 tablet 3  . metoprolol succinate (TOPROL-XL) 25 MG 24 hr tablet TAKE 1/2 TABLET BY MOUTH EVERY DAY 15 tablet 3  . prednisoLONE acetate (PRED FORTE) 1 % ophthalmic suspension Place 1 drop into the left eye daily as needed (redness and swelling).     . rivaroxaban (XARELTO) 20 MG TABS tablet Take 1 tablet (20 mg total) by mouth daily with supper.  30 tablet 5  . zinc gluconate 50 MG tablet Take 50 mg by mouth daily as needed (immune support).     No current facility-administered medications for this encounter.    No Known Allergies  Social History   Socioeconomic History  . Marital status: Married    Spouse name: Not on file  . Number of  children: Not on file  . Years of education: Not on file  . Highest education level: Not on file  Occupational History  . Not on file  Tobacco Use  . Smoking status: Never Smoker  . Smokeless tobacco: Never Used  Vaping Use  . Vaping Use: Never used  Substance and Sexual Activity  . Alcohol use: Not Currently    Alcohol/week: 2.0 standard drinks    Types: 2 Cans of beer per week    Comment: occasional/social  . Drug use: Never  . Sexual activity: Not on file  Other Topics Concern  . Not on file  Social History Narrative   L[facility]   Social Determinants of Health   Financial Resource Strain:   . Difficulty of Paying Living Expenses:   Food Insecurity:   . Worried About Programme researcher, broadcasting/film/video in the Last Year:   . Barista in the Last Year:   Transportation Needs:   . Freight forwarder (Medical):   Marland Kitchen Lack of Transportation (Non-Medical):   Physical Activity:   . Days of Exercise per Week:   . Minutes of Exercise per Session:   Stress:   . Feeling of Stress :   Social Connections:   . Frequency of Communication with Friends and Family:   . Frequency of Social Gatherings with Friends and Family:   . Attends Religious Services:   . Active Member of Clubs or Organizations:   . Attends Banker Meetings:   Marland Kitchen Marital Status:   Intimate Partner Violence:   . Fear of Current or Ex-Partner:   . Emotionally Abused:   Marland Kitchen Physically Abused:   . Sexually Abused:      ROS- All systems are reviewed and negative except as per the HPI above.  Physical Exam: Vitals:   11/15/19 0843  BP: 130/80  Pulse: 60  Weight: 96.8 kg  Height: 6\' 2"  (1.88 m)    GEN- The patient is well appearing, alert and oriented x 3 today.   HEENT-head normocephalic, atraumatic, sclera clear, conjunctiva pink, hearing intact, trachea midline. Lungs- Clear to ausculation bilaterally, normal work of breathing Heart- Regular rate and rhythm, no murmurs, rubs  or gallops  GI- soft, NT, ND, + BS Extremities- no clubbing, cyanosis, or edema MS- no significant deformity or atrophy Skin- no rash or lesion Psych- euthymic mood, full affect Neuro- strength and sensation are intact   Wt Readings from Last 3 Encounters:  11/15/19 96.8 kg  09/20/19 90.7 kg  09/06/19 93.1 kg    EKG today demonstrates SR HR 60, PR 164, QRS 116, QTc 456  Echo 06/08/19 demonstrated  1. Left ventricular ejection fraction, by estimation, is 60 to 65%. The  left ventricle has normal function. The left ventricle has no regional  wall motion abnormalities. LV diastolic filing cannot be assessed due to  underlying atrial fibrillation.  2. Right ventricular systolic function is normal. The right ventricular  size is normal. Tricuspid regurgitation signal is inadequate for assessing  PA pressure.  3. The mitral valve is normal in structure and function. No evidence  of  mitral valve regurgitation. No evidence of mitral stenosis.  4. The aortic valve is tricuspid. Aortic valve regurgitation is not  visualized. Mild aortic valve sclerosis is present, with no evidence of  aortic valve stenosis.  5. The inferior vena cava is dilated in size with >50% respiratory  variability, suggesting right atrial pressure of 8 mmHg.   Epic records are reviewed at length today  CHA2DS2-VASc Score = 0 The patient's score is based upon: CHF History: No HTN History: No Age : < 65 Diabetes History: No Stroke History: No Vascular Disease History: No Gender: Male   ASSESSMENT AND PLAN: 1. Persistent Atrial Fibrillation/atrial flutter  The patient's CHA2DS2-VASc score is 0, indicating a 0.2% annual risk of stroke.   Patient still has a significant burden of afib despite flecainide. We had a long discussion about ablation again today, questions answered. We also discussed EAST-AF trial and early rhythm control.  Patient agreeable to having ablation. Will forward to Dr Jenel Lucks office.   Continue flecainide 100 mg BID Continue Xarelto 20 mg daily in anticipation of ablation.  Continue Toprol 12.5 mg daily.  2. OSA Followed by Dr Earl Gala.  Patient reports compliance with CPAP therapy.    Follow up with Dr Johney Frame for ablation.    [name] PA-C [facility] [address] [place], [place] [address] [phone] 11/15/2019 11:05 AM

## 2019-11-22 ENCOUNTER — Telehealth: Payer: Self-pay | Admitting: *Deleted

## 2019-11-22 NOTE — Telephone Encounter (Signed)
-----   Message from Hillis Range, MD sent at 11/19/2019 12:32 PM EDT ----- Regarding: RE: ready for ablation [name], You can go ahead and schedule for ablation.  Cardiac CT prior. Also schedule for virtual visit with me on Wednesday of this coming week to make sure he has all of his questions answered.  ----- Message ----- From: Shona [place], RN Sent: 11/15/2019   9:17 AM EDT To: Hillis Range, MD, [place] Goon, RN, # Subject: ready for ablation                             Pt discussed ablation in June with allred - he's now ready to proceed - wasn't sure if he needed another visit with allred or not. Thanks [employer]

## 2019-11-22 NOTE — Telephone Encounter (Signed)
Left message for patient to call back  

## 2019-11-27 NOTE — Telephone Encounter (Signed)
Patient returning call.

## 2019-12-01 NOTE — Telephone Encounter (Signed)
lmtcb to arrange ablation date

## 2019-12-04 ENCOUNTER — Telehealth (INDEPENDENT_AMBULATORY_CARE_PROVIDER_SITE_OTHER): Payer: 59 | Admitting: Internal Medicine

## 2019-12-04 ENCOUNTER — Other Ambulatory Visit: Payer: Self-pay

## 2019-12-04 ENCOUNTER — Encounter: Payer: Self-pay | Admitting: Internal Medicine

## 2019-12-04 ENCOUNTER — Telehealth: Payer: Self-pay | Admitting: Internal Medicine

## 2019-12-04 VITALS — BP 127/72 | HR 52 | Ht 74.0 in | Wt 210.0 lb

## 2019-12-04 DIAGNOSIS — I4891 Unspecified atrial fibrillation: Secondary | ICD-10-CM

## 2019-12-04 DIAGNOSIS — I4819 Other persistent atrial fibrillation: Secondary | ICD-10-CM | POA: Diagnosis not present

## 2019-12-04 DIAGNOSIS — D6869 Other thrombophilia: Secondary | ICD-10-CM | POA: Diagnosis not present

## 2019-12-04 DIAGNOSIS — I483 Typical atrial flutter: Secondary | ICD-10-CM

## 2019-12-04 DIAGNOSIS — G4733 Obstructive sleep apnea (adult) (pediatric): Secondary | ICD-10-CM

## 2019-12-04 NOTE — Telephone Encounter (Signed)
New message:    Patient calling stating that some one called him Friday concering a apt. Patient also has a My chart apt today.

## 2019-12-04 NOTE — H&P (View-Only) (Signed)
Electrophysiology TeleHealth Note  Due to national recommendations of social distancing due to COVID 19, an audio telehealth visit is felt to be most appropriate for this patient at this time.  Verbal consent was obtained by me for the telehealth visit today.  The patient does not have capability for a virtual visit.  A phone visit is therefore required today.   Date:  12/04/2019   ID:  [name], DOB February 13, 1966, MRN [id number]  Location: patient's home  Provider location:  [place]  Evaluation Performed: Follow-up visit  PCP:  Joycelyn Rua, MD   Electrophysiologist:  Dr Johney Frame  Chief Complaint:  palpitations  History of Present Illness:    [name] is a 54 y.o. male who presents via telehealth conferencing today.  Since last being seen in our clinic, the patient reports doing reasonably well.  He continues to have some afib events. Today, he denies symptoms of palpitations, chest pain, shortness of breath,  lower extremity edema, dizziness, presyncope, or syncope.  The patient is otherwise without complaint today.     Past Medical History:  Diagnosis Date  . Persistent atrial fibrillation (HCC)   . Renal stones   . Sleep apnea    uses CPAP    Past Surgical History:  Procedure Laterality Date  . CARDIOVERSION N/A 08/08/2019   Procedure: CARDIOVERSION;  Surgeon: Chilton Si, MD;  Location: [facility] ENDOSCOPY;  Service: Cardiovascular;  Laterality: N/A;  . EXTRACORPOREAL SHOCK WAVE LITHOTRIPSY Left 06/08/2019   Procedure: EXTRACORPOREAL SHOCK WAVE LITHOTRIPSY (ESWL);  Surgeon: Noel Christmas, MD;  Location: [facility];  Service: Urology;  Laterality: Left;    Current Outpatient Medications  Medication Sig Dispense Refill  . Ascorbic Acid (VITAMIN C) 1000 MG tablet Take 1,000 mg by mouth daily as needed (immune support).    . flecainide (TAMBOCOR) 100 MG tablet Take 1 tablet (100 mg total) by mouth 2 (two) times daily. 60 tablet 3  .  metoprolol succinate (TOPROL-XL) 25 MG 24 hr tablet TAKE 1/2 TABLET BY MOUTH EVERY DAY 15 tablet 3  . prednisoLONE acetate (PRED FORTE) 1 % ophthalmic suspension Place 1 drop into the left eye daily as needed (redness and swelling).     . rivaroxaban (XARELTO) 20 MG TABS tablet Take 1 tablet (20 mg total) by mouth daily with supper. 30 tablet 5  . zinc gluconate 50 MG tablet Take 50 mg by mouth daily as needed (immune support).     No current facility-administered medications for this visit.    Allergies:   Patient has no known allergies.   Social History:  The patient  reports that he has never smoked. He has never used smokeless tobacco. He reports previous alcohol use of about 2.0 standard drinks of alcohol per week. He reports that he does not use drugs.   ROS:  Please see the history of present illness.   All other systems are personally reviewed and negative.    Exam:    Vital Signs:  BP 127/72   Pulse (!) 52   Ht 6\' 2"  (1.88 m)   Wt 210 lb (95.3 kg)   BMI 26.96 kg/m   Well sounding, alert and conversant   Labs/Other Tests and Data Reviewed:    Recent Labs: 06/08/2019: ALT 19; Magnesium 2.3; TSH 2.499 08/21/2019: BUN 15; Creatinine, Ser 1.17; Hemoglobin 15.6; Platelets 260; Potassium 5.2; Sodium 141   Wt Readings from Last 3 Encounters:  12/04/19 210 lb (95.3 kg)  11/15/19  213 lb 6.4 oz (96.8 kg)  09/20/19 200 lb (90.7 kg)       ASSESSMENT & PLAN:    1.  Persistent atrial fibrillation/ atrial flutter The patient has symptomatic, recurrent atrial arrhythmias he has failed medical therapy with flecinide He is anticoagulated with xarelto Therapeutic strategies for afib including medicine and ablation were discussed in detail with the patient today. Risk, benefits, and alternatives to EP study and radiofrequency ablation for afib were also discussed in detail today. These risks include but are not limited to stroke, bleeding, vascular damage, tamponade, perforation,  damage to the esophagus, lungs, and other structures, pulmonary vein stenosis, worsening renal function, and death. The patient understands these risk and wishes to proceed.  We will therefore proceed with catheter ablation at the next available time.  Carto, ICE, anesthesia are requested for the procedure.  Will also obtain cardiac CT prior to the procedure to exclude LAA thrombus and further evaluate atrial anatomy.  2. OSA Uses CPAP   Risks, benefits and potential toxicities for medications prescribed and/or refilled reviewed with patient today.   Patient Risk:  after full review of this patients clinical status, I feel that they are at moderate risk at this time.  Today, I have spent 15 minutes with the patient with telehealth technology discussing arrhythmia management .     Medication instructions morning of: The patient should hold ALL medications the morning of the procedure   Discharge: Our plan will be to discharge the patient same day after a period of observation     Signed, Hillis Range, MD  12/04/2019 10:12 AM     [facility] [address] [place] [address] [phone] (office) [phone] (fax)

## 2019-12-04 NOTE — Progress Notes (Signed)
   Electrophysiology TeleHealth Note  Due to national recommendations of social distancing due to COVID 19, an audio telehealth visit is felt to be most appropriate for this patient at this time.  Verbal consent was obtained by me for the telehealth visit today.  The patient does not have capability for a virtual visit.  A phone visit is therefore required today.   Date:  12/04/2019   ID:  [name], DOB 07/20/1965, MRN [id number]  Location: patient's home  Provider location:  [place]  Evaluation Performed: Follow-up visit  PCP:  Meyers, Stephen, MD   Electrophysiologist:  Dr Graysen Woodyard  Chief Complaint:  palpitations  History of Present Illness:    [name] is a 54 y.o. male who presents via telehealth conferencing today.  Since last being seen in our clinic, the patient reports doing reasonably well.  He continues to have some afib events. Today, he denies symptoms of palpitations, chest pain, shortness of breath,  lower extremity edema, dizziness, presyncope, or syncope.  The patient is otherwise without complaint today.     Past Medical History:  Diagnosis Date  . Persistent atrial fibrillation (HCC)   . Renal stones   . Sleep apnea    uses CPAP    Past Surgical History:  Procedure Laterality Date  . CARDIOVERSION N/A 08/08/2019   Procedure: CARDIOVERSION;  Surgeon: [place], Tiffany, MD;  Location: [facility] ENDOSCOPY;  Service: Cardiovascular;  Laterality: N/A;  . EXTRACORPOREAL SHOCK WAVE LITHOTRIPSY Left 06/08/2019   Procedure: EXTRACORPOREAL SHOCK WAVE LITHOTRIPSY (ESWL);  Surgeon: Pace, Maryellen D, MD;  Location: [facility];  Service: Urology;  Laterality: Left;    Current Outpatient Medications  Medication Sig Dispense Refill  . Ascorbic Acid (VITAMIN C) 1000 MG tablet Take 1,000 mg by mouth daily as needed (immune support).    . flecainide (TAMBOCOR) 100 MG tablet Take 1 tablet (100 mg total) by mouth 2 (two) times daily. 60 tablet 3  .  metoprolol succinate (TOPROL-XL) 25 MG 24 hr tablet TAKE 1/2 TABLET BY MOUTH EVERY DAY 15 tablet 3  . prednisoLONE acetate (PRED FORTE) 1 % ophthalmic suspension Place 1 drop into the left eye daily as needed (redness and swelling).     . rivaroxaban (XARELTO) 20 MG TABS tablet Take 1 tablet (20 mg total) by mouth daily with supper. 30 tablet 5  . zinc gluconate 50 MG tablet Take 50 mg by mouth daily as needed (immune support).     No current facility-administered medications for this visit.    Allergies:   Patient has no known allergies.   Social History:  The patient  reports that he has never smoked. He has never used smokeless tobacco. He reports previous alcohol use of about 2.0 standard drinks of alcohol per week. He reports that he does not use drugs.   ROS:  Please see the history of present illness.   All other systems are personally reviewed and negative.    Exam:    Vital Signs:  BP 127/72   Pulse (!) 52   Ht 6' 2" (1.88 m)   Wt 210 lb (95.3 kg)   BMI 26.96 kg/m   Well sounding, alert and conversant   Labs/Other Tests and Data Reviewed:    Recent Labs: 06/08/2019: ALT 19; Magnesium 2.3; TSH 2.499 08/21/2019: BUN 15; Creatinine, Ser 1.17; Hemoglobin 15.6; Platelets 260; Potassium 5.2; Sodium 141   Wt Readings from Last 3 Encounters:  12/04/19 210 lb (95.3 kg)  11/15/19   213 lb 6.4 oz (96.8 kg)  09/20/19 200 lb (90.7 kg)       ASSESSMENT & PLAN:    1.  Persistent atrial fibrillation/ atrial flutter The patient has symptomatic, recurrent atrial arrhythmias he has failed medical therapy with flecinide He is anticoagulated with xarelto Therapeutic strategies for afib including medicine and ablation were discussed in detail with the patient today. Risk, benefits, and alternatives to EP study and radiofrequency ablation for afib were also discussed in detail today. These risks include but are not limited to stroke, bleeding, vascular damage, tamponade, perforation,  damage to the esophagus, lungs, and other structures, pulmonary vein stenosis, worsening renal function, and death. The patient understands these risk and wishes to proceed.  We will therefore proceed with catheter ablation at the next available time.  Carto, ICE, anesthesia are requested for the procedure.  Will also obtain cardiac CT prior to the procedure to exclude LAA thrombus and further evaluate atrial anatomy.  2. OSA Uses CPAP   Risks, benefits and potential toxicities for medications prescribed and/or refilled reviewed with patient today.   Patient Risk:  after full review of this patients clinical status, I feel that they are at moderate risk at this time.  Today, I have spent 15 minutes with the patient with telehealth technology discussing arrhythmia management .     Medication instructions morning of: The patient should hold ALL medications the morning of the procedure   Discharge: Our plan will be to discharge the patient same day after a period of observation     Signed, Hillis Range, MD  12/04/2019 10:12 AM     [facility] [address] [place] [address] [phone] (office) [phone] (fax)

## 2019-12-06 ENCOUNTER — Encounter: Payer: Self-pay | Admitting: *Deleted

## 2019-12-06 ENCOUNTER — Telehealth: Payer: Self-pay | Admitting: Internal Medicine

## 2019-12-06 NOTE — Telephone Encounter (Signed)
Ablation scheduled  Work up completed  

## 2019-12-06 NOTE — Telephone Encounter (Signed)
-----   Message from Hillis Range, MD sent at 12/04/2019 10:17 AM EDT ----- Afib ablation C/I/A  Cardiac CT

## 2019-12-06 NOTE — Telephone Encounter (Signed)
[name] is calling stating he needs to reschedule his Covid screening due to a schedule conflict he was not aware of while making the appointment. Please advise.

## 2019-12-06 NOTE — Telephone Encounter (Signed)
Covid test rescheduled. Patient aware

## 2019-12-12 ENCOUNTER — Other Ambulatory Visit: Payer: 59

## 2019-12-25 ENCOUNTER — Telehealth (HOSPITAL_COMMUNITY): Payer: Self-pay | Admitting: Emergency Medicine

## 2019-12-25 NOTE — Telephone Encounter (Signed)
Attempted to call patient regarding upcoming cardiac CT appointment. °Left message on voicemail with name and callback number °Danzell Birky RN Navigator Cardiac Imaging °[facility] °[phone] Office °[phone] Cell ° °

## 2019-12-26 ENCOUNTER — Other Ambulatory Visit: Payer: 59

## 2019-12-26 ENCOUNTER — Other Ambulatory Visit: Payer: Self-pay

## 2019-12-26 DIAGNOSIS — I4819 Other persistent atrial fibrillation: Secondary | ICD-10-CM

## 2019-12-27 ENCOUNTER — Ambulatory Visit (HOSPITAL_COMMUNITY)
Admission: RE | Admit: 2019-12-27 | Discharge: 2019-12-27 | Disposition: A | Discharge status: Home / Self Care | Payer: 59 | Source: Ambulatory Visit | Attending: Internal Medicine | Admitting: Internal Medicine

## 2019-12-27 DIAGNOSIS — I4891 Unspecified atrial fibrillation: Secondary | ICD-10-CM | POA: Insufficient documentation

## 2019-12-27 LAB — CBC WITH DIFFERENTIAL/PLATELET
Basophils Absolute: 0.1 10*3/uL (ref 0.0–0.2)
Basos: 1 %
EOS (ABSOLUTE): 0.1 10*3/uL (ref 0.0–0.4)
Eos: 1 %
Hematocrit: 46.2 % (ref 37.5–51.0)
Hemoglobin: 15.5 g/dL (ref 13.0–17.7)
Immature Grans (Abs): 0 10*3/uL (ref 0.0–0.1)
Immature Granulocytes: 1 %
Lymphocytes Absolute: 2.2 10*3/uL (ref 0.7–3.1)
Lymphs: 28 %
MCH: 28.8 pg (ref 26.6–33.0)
MCHC: 33.5 g/dL (ref 31.5–35.7)
MCV: 86 fL (ref 79–97)
Monocytes Absolute: 0.6 10*3/uL (ref 0.1–0.9)
Monocytes: 7 %
Neutrophils Absolute: 4.9 10*3/uL (ref 1.4–7.0)
Neutrophils: 62 %
Platelets: 247 10*3/uL (ref 150–450)
RBC: 5.39 x10E6/uL (ref 4.14–5.80)
RDW: 13.2 % (ref 11.6–15.4)
WBC: 7.8 10*3/uL (ref 3.4–10.8)

## 2019-12-27 LAB — BASIC METABOLIC PANEL
BUN/Creatinine Ratio: 12 (ref 9–20)
BUN: 13 mg/dL (ref 6–24)
CO2: 26 mmol/L (ref 20–29)
Calcium: 9.8 mg/dL (ref 8.7–10.2)
Chloride: 100 mmol/L (ref 96–106)
Creatinine, Ser: 1.08 mg/dL (ref 0.76–1.27)
GFR calc Af Amer: 91 mL/min/{1.73_m2} (ref >59)
GFR calc non Af Amer: 78 mL/min/{1.73_m2} (ref >59)
Glucose: 115 mg/dL — ABNORMAL HIGH (ref 65–99)
Potassium: 4.8 mmol/L (ref 3.5–5.2)
Sodium: 140 mmol/L (ref 134–144)

## 2019-12-27 MED ORDER — IOHEXOL 350 MG/ML SOLN
80.0000 mL | Freq: Once | INTRAVENOUS | Status: AC | PRN
  Administered 2019-12-27: 80 mL via INTRAVENOUS

## 2019-12-28 ENCOUNTER — Emergency Department (HOSPITAL_COMMUNITY): Payer: 59

## 2019-12-28 ENCOUNTER — Emergency Department (HOSPITAL_COMMUNITY)
Admission: EM | Admit: 2019-12-28 | Discharge: 2019-12-28 | Disposition: A | Discharge status: Home / Self Care | Payer: 59 | Attending: Emergency Medicine | Admitting: Emergency Medicine

## 2019-12-28 ENCOUNTER — Other Ambulatory Visit: Payer: Self-pay

## 2019-12-28 ENCOUNTER — Encounter (HOSPITAL_COMMUNITY): Payer: Self-pay | Admitting: *Deleted

## 2019-12-28 DIAGNOSIS — R911 Solitary pulmonary nodule: Secondary | ICD-10-CM

## 2019-12-28 DIAGNOSIS — R109 Unspecified abdominal pain: Secondary | ICD-10-CM | POA: Diagnosis present

## 2019-12-28 DIAGNOSIS — M549 Dorsalgia, unspecified: Secondary | ICD-10-CM

## 2019-12-28 DIAGNOSIS — K8020 Calculus of gallbladder without cholecystitis without obstruction: Secondary | ICD-10-CM | POA: Diagnosis not present

## 2019-12-28 DIAGNOSIS — N200 Calculus of kidney: Secondary | ICD-10-CM | POA: Insufficient documentation

## 2019-12-28 DIAGNOSIS — Z79899 Other long term (current) drug therapy: Secondary | ICD-10-CM | POA: Diagnosis not present

## 2019-12-28 LAB — URINALYSIS, ROUTINE W REFLEX MICROSCOPIC
Bacteria, UA: NONE SEEN
Bilirubin Urine: NEGATIVE
Glucose, UA: NEGATIVE mg/dL
Ketones, ur: 5 mg/dL — AB
Leukocytes,Ua: NEGATIVE
Nitrite: NEGATIVE
Protein, ur: NEGATIVE mg/dL
RBC / HPF: >50 RBC/hpf — ABNORMAL HIGH (ref 0–5)
Specific Gravity, Urine: 1.016 (ref 1.005–1.030)
pH: 8 (ref 5.0–8.0)

## 2019-12-28 LAB — CBC
HCT: 43.7 % (ref 39.0–52.0)
Hemoglobin: 14.4 g/dL (ref 13.0–17.0)
MCH: 28.3 pg (ref 26.0–34.0)
MCHC: 33 g/dL (ref 30.0–36.0)
MCV: 85.9 fL (ref 80.0–100.0)
Platelets: 240 10*3/uL (ref 150–400)
RBC: 5.09 MIL/uL (ref 4.22–5.81)
RDW: 12.3 % (ref 11.5–15.5)
WBC: 10.6 10*3/uL — ABNORMAL HIGH (ref 4.0–10.5)
nRBC: 0 % (ref 0.0–0.2)

## 2019-12-28 LAB — COMPREHENSIVE METABOLIC PANEL
ALT: 24 U/L (ref 0–44)
AST: 25 U/L (ref 15–41)
Albumin: 4.5 g/dL (ref 3.5–5.0)
Alkaline Phosphatase: 56 U/L (ref 38–126)
Anion gap: 12 (ref 5–15)
BUN: 13 mg/dL (ref 6–20)
CO2: 22 mmol/L (ref 22–32)
Calcium: 9.7 mg/dL (ref 8.9–10.3)
Chloride: 104 mmol/L (ref 98–111)
Creatinine, Ser: 1.36 mg/dL — ABNORMAL HIGH (ref 0.61–1.24)
GFR calc Af Amer: >60 mL/min (ref >60)
GFR calc non Af Amer: 59 mL/min — ABNORMAL LOW (ref >60)
Glucose, Bld: 149 mg/dL — ABNORMAL HIGH (ref 70–99)
Potassium: 3.9 mmol/L (ref 3.5–5.1)
Sodium: 138 mmol/L (ref 135–145)
Total Bilirubin: 0.9 mg/dL (ref 0.3–1.2)
Total Protein: 7.5 g/dL (ref 6.5–8.1)

## 2019-12-28 LAB — TROPONIN I (HIGH SENSITIVITY): Troponin I (High Sensitivity): 5 ng/L (ref <18)

## 2019-12-28 LAB — LIPASE, BLOOD: Lipase: 43 U/L (ref 11–51)

## 2019-12-28 MED ORDER — HYDROCODONE-ACETAMINOPHEN 5-325 MG PO TABS
1.0000 | ORAL_TABLET | Freq: Once | ORAL | Status: AC
  Administered 2019-12-28: 1 via ORAL
  Filled 2019-12-28: qty 1

## 2019-12-28 MED ORDER — HYDROMORPHONE HCL 1 MG/ML IJ SOLN
1.0000 mg | Freq: Once | INTRAMUSCULAR | Status: AC
  Administered 2019-12-28: 1 mg via INTRAVENOUS
  Filled 2019-12-28: qty 1

## 2019-12-28 MED ORDER — IOHEXOL 350 MG/ML SOLN
100.0000 mL | Freq: Once | INTRAVENOUS | Status: AC | PRN
  Administered 2019-12-28: 100 mL via INTRAVENOUS

## 2019-12-28 MED ORDER — HYDROCODONE-ACETAMINOPHEN 5-325 MG PO TABS
1.0000 | ORAL_TABLET | ORAL | 0 refills | Status: DC | PRN
Start: 2019-12-28 — End: 2020-01-30

## 2019-12-28 MED ORDER — SODIUM CHLORIDE 0.9 % IV BOLUS
1000.0000 mL | Freq: Once | INTRAVENOUS | Status: AC
  Administered 2019-12-28: 1000 mL via INTRAVENOUS

## 2019-12-28 NOTE — ED Provider Notes (Signed)
[facility] EMERGENCY DEPARTMENT Provider Note   CSN: [id number] Arrival date & time: 12/28/19  3419     History Chief Complaint  Patient presents with  . Back Pain  . Abdominal Pain    [name] is a 54 y.o. male.  HPI 54 year old male presents with back pain and abdominal pain.  This started around 3 AM when he got up to go to the bathroom.  The pain has been consistent and severe ever since it started.  Feels like a twisting or dull pain.  Nothing he does makes it better or worse including certain positions or inspiration.  There is no chest pain but he feels pain across his upper abdomen, worse on the right.  He also has pain in his right thoracic back near his shoulder blade.  He has had kidney stones before on the left but states this does not really feel the same.  No urinary symptoms.  He has had nausea and had dry heaving.  No shortness of breath, cough, fever.   Past Medical History:  Diagnosis Date  . Persistent atrial fibrillation (HCC)   . Renal stones   . Sleep apnea    uses CPAP    Patient Active Problem List   Diagnosis Date Noted  . Typical atrial flutter (HCC) 09/06/2019  . Atypical atrial flutter (HCC) 08/21/2019  . Persistent atrial fibrillation (HCC) 06/13/2019  . Atrial fibrillation with RVR (HCC) 06/08/2019    Past Surgical History:  Procedure Laterality Date  . CARDIOVERSION N/A 08/08/2019   Procedure: CARDIOVERSION;  Surgeon: Chilton Si, MD;  Location: [facility] ENDOSCOPY;  Service: Cardiovascular;  Laterality: N/A;  . EXTRACORPOREAL SHOCK WAVE LITHOTRIPSY Left 06/08/2019   Procedure: EXTRACORPOREAL SHOCK WAVE LITHOTRIPSY (ESWL);  Surgeon: Noel Christmas, MD;  Location: [facility];  Service: Urology;  Laterality: Left;       Family History  Problem Relation Age of Onset  . Hypertension Mother     Social History   Tobacco Use  . Smoking status: Never Smoker  . Smokeless tobacco: Never Used   Vaping Use  . Vaping Use: Never used  Substance Use Topics  . Alcohol use: Not Currently    Alcohol/week: 2.0 standard drinks    Types: 2 Cans of beer per week    Comment: occasional/social  . Drug use: Never    Home Medications Prior to Admission medications   Medication Sig Start Date End Date Taking? Authorizing Provider  Ascorbic Acid (VITAMIN C) 1000 MG tablet Take 1,000 mg by mouth daily as needed (immune support).    [provider]  flecainide (TAMBOCOR) 100 MG tablet Take 1 tablet (100 mg total) by mouth 2 (two) times daily. 09/12/19   Fenton, Clint R, PA  metoprolol succinate (TOPROL-XL) 25 MG 24 hr tablet TAKE 1/2 TABLET BY MOUTH EVERY DAY 11/03/19   Fenton, Clint R, PA  prednisoLONE acetate (PRED FORTE) 1 % ophthalmic suspension Place 1 drop into the left eye daily as needed (redness and swelling).     [provider]  rivaroxaban (XARELTO) 20 MG TABS tablet Take 1 tablet (20 mg total) by mouth daily with supper. 09/27/19   Fenton, Clint R, PA  zinc gluconate 50 MG tablet Take 50 mg by mouth daily as needed (immune support).    [provider]    Allergies    Patient has no known allergies.  Review of Systems   Review of Systems  Constitutional: Negative for fever.  Respiratory: Negative for cough and shortness of breath.   Cardiovascular: Negative for chest pain.  Gastrointestinal: Positive for abdominal pain and nausea. Negative for vomiting.  Genitourinary: Negative for dysuria and hematuria.  Musculoskeletal: Positive for back pain.  All other systems reviewed and are negative.   Physical Exam Updated Vital Signs BP (!) 119/95 (BP Location: Right Arm)   Pulse 61   Temp 98.7 F (37.1 C) (Oral)   Resp 19   Ht 6\' 2"  (1.88 m)   Wt 93 kg   SpO2 100%   BMI 26.32 kg/m   Physical Exam Vitals and nursing note reviewed.  Constitutional:      Appearance: He is well-developed.     Comments: Appears uncomfortable, sometimes moves around  in bed to find a better position for pain  HENT:     Head: Normocephalic and atraumatic.     Right Ear: External ear normal.     Left Ear: External ear normal.     Nose: Nose normal.  Eyes:     General:        Right eye: No discharge.        Left eye: No discharge.  Cardiovascular:     Rate and Rhythm: Normal rate and regular rhythm.     Heart sounds: Normal heart sounds.  Pulmonary:     Effort: Pulmonary effort is normal.     Breath sounds: Normal breath sounds.  Abdominal:     Palpations: Abdomen is soft.     Tenderness: There is no abdominal tenderness. There is no right CVA tenderness or left CVA tenderness.  Musculoskeletal:     Cervical back: Neck supple.     Thoracic back: No tenderness.     Lumbar back: No tenderness.  Skin:    General: Skin is warm and dry.  Neurological:     Mental Status: He is alert.  Psychiatric:        Mood and Affect: Mood is not anxious.     ED Results / Procedures / Treatments   Labs (all labs ordered are listed, but only abnormal results are displayed) Labs Reviewed  COMPREHENSIVE METABOLIC PANEL - Abnormal; Notable for the following components:      Result Value   Glucose, Bld 149 (*)    Creatinine, Ser 1.36 (*)    GFR calc non Af Amer 59 (*)    All other components within normal limits  CBC - Abnormal; Notable for the following components:   WBC 10.6 (*)    All other components within normal limits  URINALYSIS, ROUTINE W REFLEX MICROSCOPIC - Abnormal; Notable for the following components:   APPearance CLOUDY (*)    Hgb urine dipstick MODERATE (*)    Ketones, ur 5 (*)    RBC / HPF >50 (*)    All other components within normal limits  LIPASE, BLOOD  TROPONIN I (HIGH SENSITIVITY)    EKG None  Radiology CT CARDIAC MORPH/PULM VEIN W/CM&W/O CA SCORE  Addendum Date: 12/27/2019   ADDENDUM REPORT: 12/27/2019 13:03 CLINICAL DATA:  Atrial fibrillation scheduled for an ablation. EXAM: Cardiac CT/CTA TECHNIQUE: A 120 kV prospective  scan was triggered in the ascending thoracic aorta at 140 HU's. Gantry rotation speed was 250 msecs and collimation was .6 mm. No beta blockade and no NTG was given. The 3D data set was reconstructed for best systolic and diastolic phases along with delayed images of the LAA Images analyzed on a dedicated work station using MPR, MIP and VRT modes.  The patient received 80 cc of contrast. FINDINGS: Image quality: Excellent. Noise artifact is: Limited. Pulmonary Veins: There is normal pulmonary vein drainage into the left atrium (2 on the right and 2 on the left) with ostial measurements as follows: RUPV: Ostium 24 mm x 21 mm  area 3.90 cm2 RLPV:  Ostium 26 mm x 18 mm  area 3.64 cm2 LUPV:  Ostium 20 mm x 19 mm area 2.95 cm2 LLPV:  Ostium 22 mm x 18 mm area 2.90 cm2 Left Atrium: The left atrial size is dilated. A moderate to large PFO is present. An accessory left atrial appendage is present in the superior/anterior aspect of the LA without thrombus. The left atrial appendage is a large windsock type with two lobes and ostial size 29.7 mm x 25 mm and length 44 mm. There is no thrombus in the left atrial appendage on contrast or delayed imaging. The esophagus runs in the left atrial midline and is not in the proximity to any of the pulmonary veins. Coronary Arteries: CAC score of 0. Normal coronary origin. Right dominance. The study was performed without use of NTG and is insufficient for plaque evaluation. The following assessment was made of the proximal segments: Left main: The left main is a large caliber vessel with a normal take off from the left coronary cusp that trifurcates into a LAD, LCX, and ramus intermedius. There is no plaque or stenosis. Left anterior descending artery: The LAD is patent without evidence of plaque or stenosis in the proximal segment. Ramus intermedius: Patent with no evidence of plaque or stenosis in the proximal segment. Left circumflex artery: The LCX is non-dominant with no evidence of  plaque or stenosis in the proximal segment. Right coronary artery: The RCA is dominant with normal take off from the right coronary cusp with no evidence of plaque or stenosis in the proximal segment. Right Atrium: Right atrial size is within normal limits. Right Ventricle: The right ventricular cavity is within normal limits. Left Ventricle: The ventricular cavity size is within normal limits. There are no stigmata of prior infarction. There is no abnormal filling defect. Normal LV function, EF=62%. No regional wall motion abnormalities. Pericardium: Normal thickness with no significant effusion or calcium present. Pulmonary Artery: Normal caliber without proximal filling defect. Cardiac valves: The aortic valve is trileaflet without significant calcification. The mitral valve is normal structure without significant calcification. Aorta: Normal caliber with no significant disease. Extra-cardiac findings: See attached radiology report for non-cardiac structures. IMPRESSION: 1. There is normal pulmonary vein drainage into the left atrium with ostial measurements above. 2. There is no thrombus in the left atrial appendage. 3. An accessory left atrial appendage is present in the superior/anterior aspect of the LA without thrombus. 4. The esophagus runs in the left atrial midline and is not in the proximity to any of the pulmonary veins. 5. A moderate to large PFO is present. 6. Normal coronary origin. Right dominance. CAC score of 0. No CAD in the proximal segments. [facility] T. Flora Lipps, MD Electronically Signed   By: Lennie Odor   On: 12/27/2019 13:03   Result Date: 12/27/2019 EXAM: OVER-READ INTERPRETATION  CT CHEST The following report is an over-read performed by radiologist Dr. Trudie Reed of [facility] on 12/27/2019. This over-read does not include interpretation of cardiac or coronary anatomy or pathology. The coronary calcium score/coronary CTA interpretation by the cardiologist is attached.  COMPARISON:  None. FINDINGS: 5 x 2 mm right middle lobe nodule (image 20 of  series 10). Within the visualized portions of the thorax there are no suspicious appearing pulmonary nodules or masses, there is no acute consolidative airspace disease, no pleural effusions, no pneumothorax and no lymphadenopathy. Visualized portions of the upper abdomen are unremarkable. There are no aggressive appearing lytic or blastic lesions noted in the visualized portions of the skeleton. IMPRESSION: 1. Tiny right middle lobe pulmonary nodule with a mean diameter of 3.5 mm, nonspecific, but statistically likely benign. No follow-up needed if patient is low-risk. Non-contrast chest CT can be considered in 12 months if patient is high-risk. This recommendation follows the consensus statement: Guidelines for Management of Incidental Pulmonary Nodules Detected on CT Images: From the Fleischner Society 2017; Radiology 2017; 284:228-243. Electronically Signed: By: Trudie Reed M.D. On: 12/27/2019 08:44    Procedures Procedures (including critical care time)  Medications Ordered in ED Medications  HYDROcodone-acetaminophen (NORCO/VICODIN) 5-325 MG per tablet 1 tablet (has no administration in time range)  HYDROmorphone (DILAUDID) injection 1 mg (1 mg Intravenous Given 12/28/19 0827)  sodium chloride 0.9 % bolus 1,000 mL (1,000 mLs Intravenous New Bag/Given 12/28/19 0851)  iohexol (OMNIPAQUE) 350 MG/ML injection 100 mL (100 mLs Intravenous Contrast Given 12/28/19 0843)    ED Course  I have reviewed the triage vital signs and the nursing notes.  Pertinent labs & imaging results that were available during my care of the patient were reviewed by me and considered in my medical decision making (see chart for details).    MDM Rules/Calculators/A&P                          Patient presents with sudden onset of pain.  Typically I would think this is likely ureteral stone given the blood in the urine and\abdominal pain.   However his back pain is more thoracic.  Given this with no reproducible symptoms, CT angiography was obtained to rule out dissection/aneurysm.  This has been personally reviewed and is negative for dissection/aneurysm or other obvious emergent pathology.  Given this, regular quadrant ultrasound was obtained since some of the pain is worse in the right.  Does show a gallstone but repeatedly on exam he has no abdominal tenderness I do not think this is cholecystitis.  Possibly of symptomatic but it might also be that he has a right ureteral stone not seen because of the IV dye.  Either way, I do not think there is no emergent condition going on and I think he can be given oral pain control at home and we discussed return precautions.  He was made aware of the possible nodule on his CT.  Otherwise, he has had on and off A. fib but now when I am seeing him his rate is around 100 and he did not take his flecainide this morning and wants to take it at home.  Appears stable for discharge home with return precautions. Final Clinical Impression(s) / ED Diagnoses Final diagnoses:  Right-sided back pain  Nephrolithiasis  Calculus of gallbladder without cholecystitis without obstruction  Pulmonary nodule    Rx / DC Orders ED Discharge Orders    None       Pricilla Loveless, MD 12/28/19 1055

## 2019-12-28 NOTE — ED Notes (Signed)
MD at bedside. 

## 2019-12-28 NOTE — Discharge Instructions (Addendum)
If you develop worsening, continued, or recurrent abdominal pain, uncontrolled vomiting, fever, chest or back pain, or any other new/concerning symptoms then return to the ER for evaluation.    Your CT scan showed a possible lung nodule.  You need to follow-up with your primary care physician for outpatient CT scan.

## 2019-12-28 NOTE — ED Notes (Signed)
Pt returned from CT °

## 2019-12-28 NOTE — ED Triage Notes (Signed)
To ED for eval lower abd pain and right mid back pain. No cp. Pt states he had a CT Scan today on his heart in prep for an ablasion next week. This pain woke pt approx 3 hrs ago. No pain with urination this am or defecation. Pt appears very uncomfortable. Unable to sit still

## 2019-12-30 ENCOUNTER — Other Ambulatory Visit (HOSPITAL_COMMUNITY): Payer: 59

## 2020-01-01 ENCOUNTER — Telehealth: Payer: Self-pay | Admitting: Internal Medicine

## 2020-01-01 ENCOUNTER — Other Ambulatory Visit (HOSPITAL_COMMUNITY)
Admission: RE | Admit: 2020-01-01 | Discharge: 2020-01-01 | Disposition: A | Discharge status: Home / Self Care | Payer: 59 | Source: Ambulatory Visit | Attending: Internal Medicine | Admitting: Internal Medicine

## 2020-01-01 DIAGNOSIS — Z20822 Contact with and (suspected) exposure to covid-19: Secondary | ICD-10-CM | POA: Diagnosis not present

## 2020-01-01 DIAGNOSIS — Z01812 Encounter for preprocedural laboratory examination: Secondary | ICD-10-CM | POA: Diagnosis not present

## 2020-01-01 LAB — SARS CORONAVIRUS 2 (TAT 6-24 HRS): SARS Coronavirus 2: NEGATIVE

## 2020-01-01 NOTE — Telephone Encounter (Signed)
Patient wanted to make sure we were aware of his recent ED before his procedure tomorrow with [name]. He has had to take new medications since the ED visit; Tamsulosin, Hydrocodone 5-325 mg, and Dulcolax.   Will forward to [name] to make him aware.

## 2020-01-01 NOTE — Progress Notes (Signed)
Attempted to call patient regarding instructions for tomorrows procedure.  Left voice mail nothing to eat of drink after midnight, need responsible adult to drive you home as well as stay overnight with adult to drive you home and stay overnight with you.  Take xarelto doses today, no meds in  the morning

## 2020-01-01 NOTE — Telephone Encounter (Signed)
New message:     Patient would like to speak with some concering his up coming apt. Please call patient back.

## 2020-01-02 ENCOUNTER — Ambulatory Visit (HOSPITAL_COMMUNITY): Payer: 59 | Admitting: Critical Care Medicine

## 2020-01-02 ENCOUNTER — Ambulatory Visit (HOSPITAL_COMMUNITY)
Admission: RE | Admit: 2020-01-02 | Discharge: 2020-01-02 | Disposition: A | Discharge status: Home / Self Care | Payer: 59 | Attending: Internal Medicine | Admitting: Internal Medicine

## 2020-01-02 ENCOUNTER — Encounter (HOSPITAL_COMMUNITY)
Admission: RE | Disposition: A | Discharge status: Home / Self Care | Payer: 59 | Source: Home / Self Care | Attending: Internal Medicine

## 2020-01-02 ENCOUNTER — Encounter (HOSPITAL_COMMUNITY): Payer: Self-pay | Admitting: Internal Medicine

## 2020-01-02 ENCOUNTER — Other Ambulatory Visit: Payer: Self-pay

## 2020-01-02 DIAGNOSIS — G4733 Obstructive sleep apnea (adult) (pediatric): Secondary | ICD-10-CM | POA: Insufficient documentation

## 2020-01-02 DIAGNOSIS — I480 Paroxysmal atrial fibrillation: Secondary | ICD-10-CM | POA: Diagnosis not present

## 2020-01-02 DIAGNOSIS — Z7901 Long term (current) use of anticoagulants: Secondary | ICD-10-CM | POA: Diagnosis not present

## 2020-01-02 DIAGNOSIS — Z79899 Other long term (current) drug therapy: Secondary | ICD-10-CM | POA: Insufficient documentation

## 2020-01-02 DIAGNOSIS — I483 Typical atrial flutter: Secondary | ICD-10-CM | POA: Insufficient documentation

## 2020-01-02 DIAGNOSIS — I4819 Other persistent atrial fibrillation: Secondary | ICD-10-CM | POA: Diagnosis present

## 2020-01-02 HISTORY — PX: ATRIAL FIBRILLATION ABLATION: EP1191

## 2020-01-02 LAB — POCT ACTIVATED CLOTTING TIME
Activated Clotting Time: 257 seconds
Activated Clotting Time: 290 seconds

## 2020-01-02 SURGERY — ATRIAL FIBRILLATION ABLATION
Anesthesia: General

## 2020-01-02 MED ORDER — SODIUM CHLORIDE 0.9% FLUSH: 3.0000 mL | INTRAVENOUS | Status: DC | PRN

## 2020-01-02 MED ORDER — SUGAMMADEX SODIUM 200 MG/2ML IV SOLN
INTRAVENOUS | Status: DC | PRN
  Administered 2020-01-02: 200 mg via INTRAVENOUS

## 2020-01-02 MED ORDER — PHENYLEPHRINE 40 MCG/ML (10ML) SYRINGE FOR IV PUSH (FOR BLOOD PRESSURE SUPPORT)
PREFILLED_SYRINGE | INTRAVENOUS | Status: DC | PRN
  Administered 2020-01-02 (×5): 80 ug via INTRAVENOUS

## 2020-01-02 MED ORDER — ONDANSETRON HCL 4 MG/2ML IJ SOLN: 4.0000 mg | Freq: Four times a day (QID) | INTRAMUSCULAR | Status: DC | PRN

## 2020-01-02 MED ORDER — PROPOFOL 10 MG/ML IV BOLUS
INTRAVENOUS | Status: DC | PRN
  Administered 2020-01-02: 50 mg via INTRAVENOUS
  Administered 2020-01-02: 150 mg via INTRAVENOUS

## 2020-01-02 MED ORDER — PANTOPRAZOLE SODIUM 40 MG PO TBEC: 40.0000 mg | DELAYED_RELEASE_TABLET | Freq: Every day | ORAL | 0 refills | Status: DC

## 2020-01-02 MED ORDER — HEPARIN (PORCINE) IN NACL 1000-0.9 UT/500ML-% IV SOLN
INTRAVENOUS | Status: AC
  Filled 2020-01-02: qty 500

## 2020-01-02 MED ORDER — ROCURONIUM BROMIDE 10 MG/ML (PF) SYRINGE
PREFILLED_SYRINGE | INTRAVENOUS | Status: DC | PRN
  Administered 2020-01-02: 10 mg via INTRAVENOUS
  Administered 2020-01-02: 50 mg via INTRAVENOUS
  Administered 2020-01-02 (×2): 20 mg via INTRAVENOUS

## 2020-01-02 MED ORDER — HEPARIN (PORCINE) IN NACL 2-0.9 UNITS/ML
INTRAMUSCULAR | Status: AC | PRN
  Administered 2020-01-02: 500 mL

## 2020-01-02 MED ORDER — SODIUM CHLORIDE 0.9 % IV SOLN: INTRAVENOUS | Status: DC

## 2020-01-02 MED ORDER — SODIUM CHLORIDE 0.9% FLUSH: 3.0000 mL | Freq: Two times a day (BID) | INTRAVENOUS | Status: DC

## 2020-01-02 MED ORDER — HEPARIN SODIUM (PORCINE) 1000 UNIT/ML IJ SOLN
INTRAMUSCULAR | Status: DC | PRN
  Administered 2020-01-02: 1000 [IU] via INTRAVENOUS
  Administered 2020-01-02: 14000 [IU] via INTRAVENOUS

## 2020-01-02 MED ORDER — DEXAMETHASONE SODIUM PHOSPHATE 10 MG/ML IJ SOLN
INTRAMUSCULAR | Status: DC | PRN
  Administered 2020-01-02: 4 mg via INTRAVENOUS

## 2020-01-02 MED ORDER — HEPARIN (PORCINE) IN NACL 1000-0.9 UT/500ML-% IV SOLN
INTRAVENOUS | Status: DC | PRN
  Administered 2020-01-02: 500 mL

## 2020-01-02 MED ORDER — LIDOCAINE 2% (20 MG/ML) 5 ML SYRINGE
INTRAMUSCULAR | Status: DC | PRN
  Administered 2020-01-02: 60 mg via INTRAVENOUS

## 2020-01-02 MED ORDER — FENTANYL CITRATE (PF) 100 MCG/2ML IJ SOLN
INTRAMUSCULAR | Status: DC | PRN
  Administered 2020-01-02: 100 ug via INTRAVENOUS

## 2020-01-02 MED ORDER — ACETAMINOPHEN 325 MG PO TABS
ORAL_TABLET | ORAL | Status: AC
  Administered 2020-01-02: 650 mg via ORAL
  Filled 2020-01-02: qty 1

## 2020-01-02 MED ORDER — FENTANYL CITRATE (PF) 100 MCG/2ML IJ SOLN: 25.0000 ug | INTRAMUSCULAR | Status: DC | PRN

## 2020-01-02 MED ORDER — MIDAZOLAM HCL 5 MG/5ML IJ SOLN
INTRAMUSCULAR | Status: DC | PRN
  Administered 2020-01-02: 2 mg via INTRAVENOUS

## 2020-01-02 MED ORDER — ONDANSETRON HCL 4 MG/2ML IJ SOLN
INTRAMUSCULAR | Status: DC | PRN
  Administered 2020-01-02: 4 mg via INTRAVENOUS

## 2020-01-02 MED ORDER — HEPARIN SODIUM (PORCINE) 1000 UNIT/ML IJ SOLN
INTRAMUSCULAR | Status: AC
  Filled 2020-01-02: qty 1

## 2020-01-02 MED ORDER — HYDROCODONE-ACETAMINOPHEN 5-325 MG PO TABS: 1.0000 | ORAL_TABLET | ORAL | Status: DC | PRN

## 2020-01-02 MED ORDER — ACETAMINOPHEN 325 MG PO TABS: 650.0000 mg | ORAL_TABLET | ORAL | Status: DC | PRN

## 2020-01-02 MED ORDER — PROTAMINE SULFATE 10 MG/ML IV SOLN
INTRAVENOUS | Status: DC | PRN
  Administered 2020-01-02 (×3): 10 mg via INTRAVENOUS

## 2020-01-02 MED ORDER — HEPARIN SODIUM (PORCINE) 1000 UNIT/ML IJ SOLN
INTRAMUSCULAR | Status: DC | PRN
  Administered 2020-01-02: 5000 [IU] via INTRAVENOUS
  Administered 2020-01-02: 2000 [IU] via INTRAVENOUS

## 2020-01-02 MED ORDER — SODIUM CHLORIDE 0.9 % IV SOLN: 250.0000 mL | INTRAVENOUS | Status: DC | PRN

## 2020-01-02 SURGICAL SUPPLY — 21 items
BLANKET WARM UNDERBOD FULL ACC (MISCELLANEOUS) ×2 IMPLANT
CATH MAPPNG PENTARAY F 2-6-2MM (CATHETERS) IMPLANT
CATH S CIRCA THERM PROBE 10F (CATHETERS) ×1 IMPLANT
CATH SMTCH THERMOCOOL SF DF (CATHETERS) ×1 IMPLANT
CATH SOUNDSTAR ECO 8FR (CATHETERS) ×1 IMPLANT
CATH WEBSTER BI DIR CS D-F CRV (CATHETERS) ×1 IMPLANT
COVER SWIFTLINK CONNECTOR (BAG) ×2 IMPLANT
DEVICE CLOSURE PERCLS PRGLD 6F (VASCULAR PRODUCTS) IMPLANT
PACK EP LATEX FREE (CUSTOM PROCEDURE TRAY) ×2
PACK EP LF (CUSTOM PROCEDURE TRAY) ×1 IMPLANT
PAD PRO RADIOLUCENT 2001M-C (PAD) ×2 IMPLANT
PATCH CARTO3 (PAD) ×1 IMPLANT
PENTARAY F 2-6-2MM (CATHETERS) ×2
PERCLOSE PROGLIDE 6F (VASCULAR PRODUCTS) ×8
SHEATH BAYLIS TRANSSEPTAL 98CM (NEEDLE) ×1 IMPLANT
SHEATH CARTO VIZIGO SM CVD (SHEATH) ×1 IMPLANT
SHEATH PINNACLE 7F 10CM (SHEATH) ×1 IMPLANT
SHEATH PINNACLE 8F 10CM (SHEATH) ×1 IMPLANT
SHEATH PINNACLE 9F 10CM (SHEATH) ×1 IMPLANT
SHEATH PROBE COVER 6X72 (BAG) ×1 IMPLANT
TUBING SMART ABLATE COOLFLOW (TUBING) ×1 IMPLANT

## 2020-01-02 NOTE — Interval H&P Note (Signed)
History and Physical Interval Note:  01/02/2020 10:23 AM  [name]  has presented today for surgery, with the diagnosis of afib.  The various methods of treatment have been discussed with the patient and family. After consideration of risks, benefits and other options for treatment, the patient has consented to  Procedure(s): ATRIAL FIBRILLATION ABLATION (N/A) as a surgical intervention.  The patient's history has been reviewed, patient examined, no change in status, stable for surgery.  I have reviewed the patient's chart and labs.  Questions were answered to the patient's satisfaction.     Cardiac CT reviewed with patient at length today.  No LAA thrombus.  He reports compliance with xarelto without interruption.  He had abdominal pain last which for which he was evaluated in the ER.  Workup was mostly unrevealing.  He has been advised to follow-up with urology for presumed renal calculi.   Hillis Range

## 2020-01-02 NOTE — Transfer of Care (Signed)
Immediate Anesthesia Transfer of Care Note  Patient: [name]  Procedure(s) Performed: ATRIAL FIBRILLATION ABLATION (N/A )  Patient Location: PACU and Cath Lab  Anesthesia Type:General  Level of Consciousness: awake, alert  and oriented  Airway & Oxygen Therapy: Patient Spontanous Breathing and Patient connected to nasal cannula oxygen  Post-op Assessment: Report given to RN and Post -op Vital signs reviewed and stable  Post vital signs: Reviewed and stable  Last Vitals:  Vitals Value Taken Time  BP    Temp    Pulse    Resp    SpO2      Last Pain:  Vitals:   01/02/20 0915  TempSrc:   PainSc: 0-No pain         Complications: No complications documented.

## 2020-01-02 NOTE — Discharge Instructions (Signed)

## 2020-01-02 NOTE — Anesthesia Procedure Notes (Addendum)
Procedure Name: Intubation Date/Time: 01/02/2020 11:10 AM Performed by: Trinna Post., CRNA Pre-anesthesia Checklist: Patient identified, Emergency Drugs available, Suction available, Patient being monitored and Timeout performed Patient Re-evaluated:Patient Re-evaluated prior to induction Oxygen Delivery Method: Circle system utilized Preoxygenation: Pre-oxygenation with 100% oxygen Induction Type: IV induction Ventilation: Two handed mask ventilation required and Oral airway inserted - appropriate to patient size Laryngoscope Size: Glidescope and 4 Grade View: Grade I Tube type: Oral Tube size: 8.0 mm Number of attempts: 3 Airway Equipment and Method: Rigid stylet and Video-laryngoscopy Placement Confirmation: ETT inserted through vocal cords under direct vision,  positive ETCO2 and breath sounds checked- equal and bilateral Secured at: 22 cm Tube secured with: Tape Dental Injury: Teeth and Oropharynx as per pre-operative assessment  Difficulty Due To: Difficulty was unanticipated and Difficult Airway- due to anterior larynx Comments: DL x1 with MAC 4 by CRNA with no view, DL x1 with Sabra Heck 2 by CRNA and x1 by MD with no view due to anterior larynx. Patient easy to mask ventilate with OAW in between attempts and maintained O2 sats at 100% throughout induction. Airway  secured with an S4 GoPro blade. No adverse events. Would recommend glidescope for future intubations.

## 2020-01-02 NOTE — Anesthesia Preprocedure Evaluation (Addendum)
Anesthesia Evaluation  Patient identified by MRN, date of birth, ID band Patient awake    Reviewed: Allergy & Precautions, NPO status , Patient's Chart, lab work & pertinent test results  Airway Mallampati: II  TM Distance: >3 FB     Dental   Pulmonary    breath sounds clear to auscultation       Cardiovascular + dysrhythmias Atrial Fibrillation  Rhythm:Regular Rate:Normal  History noted   Neuro/Psych    GI/Hepatic negative GI ROS, Neg liver ROS,   Endo/Other    Renal/GU Renal disease     Musculoskeletal   Abdominal   Peds  Hematology   Anesthesia Other Findings   Reproductive/Obstetrics                             Anesthesia Physical Anesthesia Plan  ASA: III  Anesthesia Plan: General   Post-op Pain Management:    Induction: Intravenous  PONV Risk Score and Plan: 2 and Ondansetron, Dexamethasone and Midazolam  Airway Management Planned: Oral ETT  Additional Equipment:   Intra-op Plan:   Post-operative Plan: Extubation in OR  Informed Consent: I have reviewed the patients History and Physical, chart, labs and discussed the procedure including the risks, benefits and alternatives for the proposed anesthesia with the patient or authorized representative who has indicated his/her understanding and acceptance.     Dental advisory given  Plan Discussed with: Anesthesiologist  Anesthesia Plan Comments:         Anesthesia Quick Evaluation

## 2020-01-02 NOTE — Anesthesia Postprocedure Evaluation (Signed)
Anesthesia Post Note  Patient: [name]  Procedure(s) Performed: ATRIAL FIBRILLATION ABLATION (N/A )     Anesthesia Type: General Level of consciousness: awake Pain management: pain level controlled Vital Signs Assessment: post-procedure vital signs reviewed and stable Respiratory status: spontaneous breathing Cardiovascular status: stable Postop Assessment: no apparent nausea or vomiting Anesthetic complications: no   No complications documented.  Last Vitals:  Vitals:   01/02/20 1415 01/02/20 1425  BP: 135/63 120/62  Pulse: 69 69  Resp: (!) 21 14  Temp:  37 C  SpO2: 99% 98%    Last Pain:  Vitals:   01/02/20 1425  TempSrc: Temporal  PainSc:                  Jordie Schreur

## 2020-01-05 ENCOUNTER — Telehealth (HOSPITAL_COMMUNITY): Payer: Self-pay | Admitting: *Deleted

## 2020-01-05 NOTE — Telephone Encounter (Signed)
Fu   Pt called in and he is now home from Procedure and he has some additional question and would like to speak with Inetta Fermo.   He has some questions in  reference  to reoccurring Afib .    Best call back number is [phone]

## 2020-01-05 NOTE — Telephone Encounter (Signed)
Answered all questions. The patient stated that he went into Afib this morning around ~5 am and it has remained at this time. Patient verbalized understanding of plan to call the Afib clinic to see if Clide Cliff has any recommendations or wants to see him sooner post ablation.

## 2020-01-05 NOTE — Telephone Encounter (Signed)
duplicate

## 2020-01-05 NOTE — Telephone Encounter (Signed)
Post ablation information reviewed with pt - he has been out of rhythm since he thinks about 4 am - HRs ranging from 120-140s BP 135/82 he will take a 12.5mg  of metoprolol now and call back later this afternoon with update. If still out of rhythm will continue metoprolol 12.5mg  BID until he converts - if still out Monday will bring in for assessment per Rudi Coco NP.

## 2020-01-05 NOTE — Telephone Encounter (Signed)
Patient states HR is still around 80-120 He will continue metoprolol bid until HR normalizes - he has apple watch so will monitor rhythm through this.  He will call on Monday with update.

## 2020-01-07 ENCOUNTER — Other Ambulatory Visit (HOSPITAL_COMMUNITY): Payer: Self-pay | Admitting: Physician Assistant

## 2020-01-07 NOTE — Telephone Encounter (Signed)
Stacy, follow-up with him this week.  Hopefully his rhythm with normalize.

## 2020-01-08 NOTE — Telephone Encounter (Signed)
Patient called with update - had to take metoprolol BID Friday and Saturday then heart rate normalized on Sunday and continues in normal rhythm today. He will use extra 1/2 tablet of metoprolol as needed for breakthrough afib but will return to his normal dosing of 1/2 tablet at bedtime only. HR this morning was 55-60. Pt will call if issues before follow up.

## 2020-01-25 ENCOUNTER — Other Ambulatory Visit: Payer: Self-pay | Admitting: Internal Medicine

## 2020-01-30 ENCOUNTER — Ambulatory Visit (HOSPITAL_COMMUNITY)
Admission: RE | Admit: 2020-01-30 | Discharge: 2020-01-30 | Disposition: A | Discharge status: Home / Self Care | Payer: 59 | Source: Ambulatory Visit | Attending: Physician Assistant | Admitting: Physician Assistant

## 2020-01-30 ENCOUNTER — Other Ambulatory Visit: Payer: Self-pay

## 2020-01-30 VITALS — BP 144/82 | HR 55 | Ht 74.0 in | Wt 203.2 lb

## 2020-01-30 DIAGNOSIS — Z7901 Long term (current) use of anticoagulants: Secondary | ICD-10-CM | POA: Diagnosis not present

## 2020-01-30 DIAGNOSIS — I4819 Other persistent atrial fibrillation: Secondary | ICD-10-CM | POA: Diagnosis not present

## 2020-01-30 DIAGNOSIS — I4892 Unspecified atrial flutter: Secondary | ICD-10-CM | POA: Insufficient documentation

## 2020-01-30 DIAGNOSIS — G4733 Obstructive sleep apnea (adult) (pediatric): Secondary | ICD-10-CM | POA: Diagnosis not present

## 2020-01-30 DIAGNOSIS — Z79899 Other long term (current) drug therapy: Secondary | ICD-10-CM | POA: Insufficient documentation

## 2020-01-30 NOTE — Progress Notes (Signed)
Primary Care Physician: Joycelyn Rua, MD Primary Cardiologist: none Primary Electrophysiologist: Dr Johney Frame Referring Physician: Dr Boris Sharper is a 54 y.o. male with a history of nephrolithiasis, OSA, and new onset atrial fibrillation who presents for follow up in the [facility].  The patient was initially diagnosed with atrial fibrillation 06/08/19 after presenting for a ESWL for kidney stones. Afib with RVR noted incidentally and he was asymptomatic. Patient is not on anticoagulation with a CHADS2VASC score of 0. He was started on metoprolol and discharged in rate controlled afib. Patient reports that he may feel a "flutter" occasionally but he denies any other symptoms. He denies significant alcohol use. Patient is s/p unsuccessful DCCV on 08/08/19. Patient has started using his CPAP machine. Patient presented for DCCV on 08/30/19 and was found to be in SR after increasing flecainide. Unfortunately, he was back in rate controlled atrial flutter on follow up.  On follow up today, patient is s/p afib and flutter ablation with Dr Johney Frame on 01/02/20. He has done reasonably well since the procedure. He has had several episodes of afib which have all been <24 hours. He denies any CP, swallowing, or groin issues.   Today, he denies symptoms of chest pain, shortness of breath, orthopnea, PND, lower extremity edema, dizziness, presyncope, syncope, bleeding, or neurologic sequela. The patient is tolerating medications without difficulties and is otherwise without complaint today.    Atrial Fibrillation Risk Factors:  he does have symptoms or diagnosis of sleep apnea.  He is compliant with CPAP therapy. he does not have a history of rheumatic fever. he does not have a history of alcohol use. The patient does not have a history of early familial atrial fibrillation or other arrhythmias.  he has a BMI of Body mass index is 26.09 kg/m.Marland Kitchen Filed Weights    01/30/20 1121  Weight: 92.2 kg    Family History  Problem Relation Age of Onset  . Hypertension Mother      Atrial Fibrillation Management history:  Previous antiarrhythmic drugs: flecainide Previous cardioversions: 08/08/19 Previous ablations: 01/02/20 CHADS2VASC score: 0 Anticoagulation history: Xarelto   Past Medical History:  Diagnosis Date  . Persistent atrial fibrillation (HCC)   . Renal stones   . Sleep apnea    uses CPAP   Past Surgical History:  Procedure Laterality Date  . ATRIAL FIBRILLATION ABLATION N/A 01/02/2020   Procedure: ATRIAL FIBRILLATION ABLATION;  Surgeon: Hillis Range, MD;  Location: [facility] INVASIVE CV LAB;  Service: Cardiovascular;  Laterality: N/A;  . CARDIOVERSION N/A 08/08/2019   Procedure: CARDIOVERSION;  Surgeon: Chilton Si, MD;  Location: [facility] ENDOSCOPY;  Service: Cardiovascular;  Laterality: N/A;  . EXTRACORPOREAL SHOCK WAVE LITHOTRIPSY Left 06/08/2019   Procedure: EXTRACORPOREAL SHOCK WAVE LITHOTRIPSY (ESWL);  Surgeon: Noel Christmas, MD;  Location: [facility];  Service: Urology;  Laterality: Left;    Current Outpatient Medications  Medication Sig Dispense Refill  . Ascorbic Acid (VITAMIN C) 1000 MG tablet Take 1,000 mg by mouth once a week.     . flecainide (TAMBOCOR) 100 MG tablet TAKE 1 TABLET BY MOUTH TWICE A DAY 60 tablet 3  . metoprolol succinate (TOPROL-XL) 25 MG 24 hr tablet TAKE 1/2 TABLET BY MOUTH EVERY DAY (Patient taking differently: Take 12.5 mg by mouth daily. ) 15 tablet 3  . pantoprazole (PROTONIX) 40 MG tablet Take 1 tablet (40 mg total) by mouth daily. 45 tablet 0  . prednisoLONE acetate (PRED FORTE) 1 % ophthalmic  suspension Place 1 drop into the left eye daily as needed (redness and swelling).     . rivaroxaban (XARELTO) 20 MG TABS tablet Take 1 tablet (20 mg total) by mouth daily with supper. 30 tablet 5  . tamsulosin (FLOMAX) 0.4 MG CAPS capsule Take 0.4 mg by mouth every other day.     . zinc gluconate  50 MG tablet Take 50 mg by mouth once a week.      No current facility-administered medications for this encounter.    No Known Allergies  Social History   Socioeconomic History  . Marital status: Married    Spouse name: Not on file  . Number of children: Not on file  . Years of education: Not on file  . Highest education level: Not on file  Occupational History  . Not on file  Tobacco Use  . Smoking status: Never Smoker  . Smokeless tobacco: Never Used  Vaping Use  . Vaping Use: Never used  Substance and Sexual Activity  . Alcohol use: Not Currently    Alcohol/week: 2.0 standard drinks    Types: 2 Cans of beer per week    Comment: occasional/social  . Drug use: Never  . Sexual activity: Not on file  Other Topics Concern  . Not on file  Social History Narrative   Lives in [facility]   Social Determinants of Health   Financial Resource Strain:   . Difficulty of Paying Living Expenses: Not on file  Food Insecurity:   . Worried About Programme researcher, broadcasting/film/video in the Last Year: Not on file  . Ran Out of Food in the Last Year: Not on file  Transportation Needs:   . Lack of Transportation (Medical): Not on file  . Lack of Transportation (Non-Medical): Not on file  Physical Activity:   . Days of Exercise per Week: Not on file  . Minutes of Exercise per Session: Not on file  Stress:   . Feeling of Stress : Not on file  Social Connections:   . Frequency of Communication with Friends and Family: Not on file  . Frequency of Social Gatherings with Friends and Family: Not on file  . Attends Religious Services: Not on file  . Active Member of Clubs or Organizations: Not on file  . Attends Banker Meetings: Not on file  . Marital Status: Not on file  Intimate Partner Violence:   . Fear of Current or Ex-Partner: Not on file  . Emotionally Abused: Not on file  . Physically Abused: Not on file  . Sexually Abused: Not on file     ROS- All systems are  reviewed and negative except as per the HPI above.  Physical Exam: Vitals:   01/30/20 1121  BP: (!) 144/82  Pulse: (!) 55  Weight: 92.2 kg  Height: 6\' 2"  (1.88 m)    GEN- The patient is well appearing, alert and oriented x 3 today.   HEENT-head normocephalic, atraumatic, sclera clear, conjunctiva pink, hearing intact, trachea midline. Lungs- Clear to ausculation bilaterally, normal work of breathing Heart- Regular rate and rhythm, no murmurs, rubs or gallops  GI- soft, NT, ND, + BS Extremities- no clubbing, cyanosis, or edema MS- no significant deformity or atrophy Skin- no rash or lesion Psych- euthymic mood, full affect Neuro- strength and sensation are intact   Wt Readings from Last 3 Encounters:  01/30/20 92.2 kg  01/02/20 93 kg  12/28/19 93 kg    EKG today demonstrates  SB HR 55, PR 174, QRS 114, QTc 443  Echo 06/08/19 demonstrated  1. Left ventricular ejection fraction, by estimation, is 60 to 65%. The  left ventricle has normal function. The left ventricle has no regional  wall motion abnormalities. LV diastolic filing cannot be assessed due to  underlying atrial fibrillation.  2. Right ventricular systolic function is normal. The right ventricular  size is normal. Tricuspid regurgitation signal is inadequate for assessing  PA pressure.  3. The mitral valve is normal in structure and function. No evidence of  mitral valve regurgitation. No evidence of mitral stenosis.  4. The aortic valve is tricuspid. Aortic valve regurgitation is not  visualized. Mild aortic valve sclerosis is present, with no evidence of  aortic valve stenosis.  5. The inferior vena cava is dilated in size with >50% respiratory  variability, suggesting right atrial pressure of 8 mmHg.   Epic records are reviewed at length today  CHA2DS2-VASc Score = 0 The patient's score is based upon: CHF History: No HTN History: No Age : < 65 Diabetes History: No Stroke History: No Vascular  Disease History: No Gender: Male   ASSESSMENT AND PLAN: 1. Persistent Atrial Fibrillation/atrial flutter  The patient's CHA2DS2-VASc score is 0, indicating a 0.2% annual risk of stroke.   S/p afib and typical flutter ablation with Dr Johney Frame on 01/02/20. Reassured patient that breakthrough episodes of afib are not uncommon post ablation. Encouraged him to reach out for persistent episodes.  Continue flecainide 100 mg BID Continue Xarelto 20 mg daily for at least 3 months post ablation.  Continue Toprol 12.5 mg daily.  2. OSA Followed by Dr Earl Gala.  Patient reports compliance with CPAP therapy.   Follow up with Dr Johney Frame as scheduled.    [name] PA-C Afib Clinic [facility] [address] [place], [place] [address] [phone] 01/30/2020 12:00 PM

## 2020-01-31 ENCOUNTER — Other Ambulatory Visit: Payer: Self-pay | Admitting: Internal Medicine

## 2020-02-01 ENCOUNTER — Telehealth: Payer: Self-pay | Admitting: *Deleted

## 2020-02-01 ENCOUNTER — Ambulatory Visit: Payer: Self-pay | Admitting: General Surgery

## 2020-02-01 NOTE — Telephone Encounter (Signed)
   [facility] Pre-operative Risk Assessment    HEARTCARE STAFF: - Please ensure there is not already an duplicate clearance open for this procedure. - Under Visit Info/Reason for Call, type in Other and utilize the format Clearance MM/DD/YY or Clearance TBD. Do not use dashes or single digits. - If request is for dental extraction, please clarify the # of teeth to be extracted.  Request for surgical clearance:  1. What type of surgery is being performed? GALLBLADDER SURGERY   2. When is this surgery scheduled? TBD  3. What type of clearance is required (medical clearance vs. Pharmacy clearance to hold med vs. Both)? BOTH  4. Are there any medications that need to be held prior to surgery and how long? [place]    5. Practice name and name of physician performing surgery? [facility]; DR. ERIC WILSON   6. What is the office phone number? [phone]   7.   What is the office fax number? [phone] ATTN: Dia Crawford, RMA  8.   Anesthesia type (None, local, MAC, general) ? GENERAL   Julaine Hua 02/01/2020, 3:02 PM  _________________________________________________________________   (provider comments below)

## 2020-02-01 NOTE — H&P (Signed)
[name] Appointment: 02/01/2020 11:30 AM Location: [facility] Patient #: [id number] DOB: 1965/06/02 Married / Language: [place] / Race: White Male  History of Present Illness [name] M. [name] [name]; 02/01/2020 12:06 PM) The patient is a 54 year old male who presents for evaluation of gall stones. he is referred by Dr Criss Alvine to discuss gallstones. He states that he has paroxysmal atrial fibrillation and on a Wednesday in September he went for a preprocedure CT scan. That evening he developed pain in his right upper back which radiated to his right upper quadrant and epigastrium. He felt bloated. He felt pressure. It felt like a spasm. It was fairly intense. After 3 hours it did not get better so he went to the emergency room. He had tried walking around and sitting in a hot tub without relief. In the emergency room he was found to have some blood in his urine. He had a mildly elevated creatinine. His white count was slightly elevated at 10.6. Otherwise his labs were negative. He had a CT scan which demonstrated 2-3 mm nonobstructing right kidney stones. He also underwent an abdominal ultrasound which showed multiple gallstones without any evidence of cholecystitis. It was felt that his symptoms are more gallbladder related. He reports a similar episode last Christmas Eve where he had pain in his right upper back right side and right upper quadrant that was fairly intense but he stopped after an hour or 2. He had bloating with it. Since being in the ER in mid-September he reports 3 additional episodes of the same type of pain October 3, October 4 of October 17. They were not as intense as the trips that made him go to the emergency room. He did take a pain pill that he was prescribed for the episodes on the third and fourth. He is had a prior kidney stone the left and states that this pain was different. He underwent cardiac ablation in mid-September. He is on xarelto.  He states his cardiologist told him he would need to stay on the blood thinner for 3 months. He has sleep apnea and uses CPAP. No prior abdominal surgery. No TIAs or amaurosis fugax. No chest pain, chest pressure, source of breath, dyspnea on exertion   Problem List/Past Medical [name] M. [name], [name]; 02/01/2020 12:07 PM) ANTIPLATELET OR ANTITHROMBOTIC LONG-TERM USE (Z79.02) PAROXYSMAL ATRIAL FIBRILLATION (I48.0) SYMPTOMATIC CHOLELITHIASIS (K80.20) UMBILICAL HERNIA WITHOUT OBSTRUCTION OR GANGRENE (K42.9)  Past Surgical History [name], RMA; 02/01/2020 11:22 AM) No pertinent past surgical history  Diagnostic Studies History [name], [name]; 02/01/2020 11:22 AM) Colonoscopy never  Allergies [name], RMA; 02/01/2020 11:23 AM) No Known Drug Allergies [02/01/2020]: Allergies Reconciled  Medication History [name], [name]; 02/01/2020 11:24 AM) Flecainide Acetate (100MG  Tablet, Oral) Active. Pantoprazole Sodium (40MG  Tablet DR, Oral) Active. HYDROcodone-Acetaminophen (5-325MG  Tablet, Oral) Active. Tamsulosin HCl (0.4MG  Capsule, Oral) Active. Metoprolol Succinate ER (25MG  Tablet ER 24HR, Oral) Active. Xarelto (20MG  Tablet, Oral) Active. Medications Reconciled  Social History , ; 02/01/2020 11:22 AM) Alcohol use Occasional alcohol use. No caffeine use No drug use Tobacco use Never smoker.  Family History , [name]; 02/01/2020 11:22 AM) Arthritis Father, Mother. Colon Polyps Father, Mother. Heart Disease Father. Hypertension Father, Mother. Ischemic Bowel Disease Father, Mother. Seizure disorder Mother.  Other Problems 02/03/2020 [name], [name]; 02/01/2020 12:07 PM) Atrial Fibrillation Kidney Stone Sleep Apnea     Review of Systems [name] [name] RMA; 02/01/2020 11:22 AM) General Not Present- Appetite Loss, Chills, Fatigue, Fever, Night  Sweats, Weight Gain and Weight Loss. Skin Not Present- Change in  Wart/Mole, Dryness, Hives, Jaundice, New Lesions, Non-Healing Wounds, Rash and Ulcer. HEENT Not Present- Earache, Hearing Loss, Hoarseness, Nose Bleed, Oral Ulcers, Ringing in the Ears, Seasonal Allergies, Sinus Pain, Sore Throat, Visual Disturbances, Wears glasses/contact lenses and Yellow Eyes. Respiratory Not Present- Bloody sputum, Chronic Cough, Difficulty Breathing, Snoring and Wheezing. Breast Not Present- Breast Mass, Breast Pain, Nipple Discharge and Skin Changes. Cardiovascular Not Present- Chest Pain, Difficulty Breathing Lying Down, Leg Cramps, Palpitations, Rapid Heart Rate, Shortness of Breath and Swelling of Extremities. Gastrointestinal Present- Abdominal Pain and Bloating. Not Present- Bloody Stool, Change in Bowel Habits, Chronic diarrhea, Constipation, Difficulty Swallowing, Excessive gas, Gets full quickly at meals, Hemorrhoids, Indigestion, Nausea, Rectal Pain and Vomiting. Male Genitourinary Not Present- Blood in Urine, Change in Urinary Stream, Frequency, Impotence, Nocturia, Painful Urination, Urgency and Urine Leakage. Musculoskeletal Not Present- Back Pain, Joint Pain, Joint Stiffness, Muscle Pain, Muscle Weakness and Swelling of Extremities. Neurological Not Present- Decreased Memory, Fainting, Headaches, Numbness, Seizures, Tingling, Tremor, Trouble walking and Weakness. Psychiatric Not Present- Anxiety, Bipolar, Change in Sleep Pattern, Depression, Fearful and Frequent crying. Endocrine Not Present- Cold Intolerance, Excessive Hunger, Hair Changes, Heat Intolerance, Hot flashes and New Diabetes. Hematology Not Present- Blood Thinners, Easy Bruising, Excessive bleeding, Gland problems, HIV and Persistent Infections.  Vitals [name] RMA; 02/01/2020 11:24 AM) 02/01/2020 11:24 AM Weight: 196.5 lb Height: 72in Body Surface Area: 2.11 m Body Mass Index: 26.65 kg/m  Temp.: 98.57F  Pulse: 74 (Regular)  P.OX: 99% (Room air) BP: 122/86(Sitting, Left Arm,  Standard)        Physical Exam [name] M. [name] [name]; 02/01/2020 12:03 PM)  General Mental Status-Alert. General Appearance-Consistent with stated age. Hydration-Well hydrated. Voice-Normal.  Head and Neck Head-normocephalic, atraumatic with no lesions or palpable masses. Trachea-midline. Thyroid Gland Characteristics - normal size and consistency.  Eye Eyeball - Bilateral-Extraocular movements intact. Sclera/Conjunctiva - Bilateral-No scleral icterus.  Chest and Lung Exam Chest and lung exam reveals -quiet, even and easy respiratory effort with no use of accessory muscles and on auscultation, normal breath sounds, no adventitious sounds and normal vocal resonance. Inspection Chest Wall - Normal. Back - normal.  Breast - Did not examine.  Cardiovascular Cardiovascular examination reveals -normal heart sounds, regular rate and rhythm with no murmurs and normal pedal pulses bilaterally.  Abdomen Inspection  Inspection of the abdomen reveals: Note: Small fascial defect at the umbilicus. Skin - Scar - no surgical scars. Palpation/Percussion Palpation and Percussion of the abdomen reveal - Soft, Non Tender, No Rebound tenderness, No Rigidity (guarding) and No hepatosplenomegaly. Auscultation Auscultation of the abdomen reveals - Bowel sounds normal.  Peripheral Vascular Upper Extremity Palpation - Pulses bilaterally normal.  Neurologic Neurologic evaluation reveals -alert and oriented x 3 with no impairment of recent or remote memory. Mental Status-Normal.  Neuropsychiatric The patient's mood and affect are described as -normal. Judgment and Insight-insight is appropriate concerning matters relevant to self.  Musculoskeletal Normal Exam - Left-Upper Extremity Strength Normal and Lower Extremity Strength Normal. Normal Exam - Right-Upper Extremity Strength Normal and Lower Extremity Strength Normal.  Lymphatic Head & Neck  General  Head & Neck Lymphatics: Bilateral - Description - Normal. Axillary - Did not examine. Femoral & Inguinal - Did not examine.    Assessment & Plan [name] M. Kasey Ewings [name]; 02/01/2020 12:07 PM)  SYMPTOMATIC CHOLELITHIASIS (K80.20) Impression: I believe the patient's symptoms are consistent with gallbladder disease.  We discussed gallbladder disease. The patient was given educational  material. We discussed non-operative and operative management. We discussed the signs & symptoms of acute cholecystitis  I discussed laparoscopic cholecystectomy with IOC in detail. The patient was given educational material as well as diagrams detailing the procedure. We discussed the risks and benefits of a laparoscopic cholecystectomy including, but not limited to bleeding, infection, injury to surrounding structures such as the intestine or liver, bile leak, retained gallstones, need to convert to an open procedure, prolonged diarrhea, blood clots such as DVT, common bile duct injury, anesthesia risks, and possible need for additional procedures. We discussed the typical post-operative recovery course. I explained that the likelihood of improvement of their symptoms is good.  The patient has elected to proceed with surgery PENDING cardiac clearance and permission to antiplatelet med  This patient encounter took 43 minutes today to perform the following: take history, perform exam, review outside records, interpret imaging, counsel the patient on their diagnosis and document encounter, findings & plan in the EHR  Current Plans Pt Education - Pamphlet Given - Laparoscopic Gallbladder Surgery: discussed with patient and provided information. I recommended obtaining preoperative cardiac clearance. I am concerned about the health of the patient and the ability to tolerate the operation. Therefore, we will request clearance by cardiology to better assess operative risk & see if a reevaluation, further workup, etc is needed.  Also recommendations on how medications such as for anticoagulation and blood pressure should be managed/held/restarted after surgery.  PAROXYSMAL ATRIAL FIBRILLATION (I48.0) Impression: We will talk to his cardiologist and see if we can get cardiac clearance as well as permission to hold his blood thinner. I am concerned that he is getting more symptomatic from his gallbladder since he has had several episodes over the past 2 weeks. If he is not cleared for surgery until he is 3 months out from his ablation then we talked about him undergoing cholecystostomy tube placement should he develop acute cholecystitis during that time.   ANTIPLATELET OR ANTITHROMBOTIC LONG-TERM USE (Z79.02) Impression: The patient states that his cardiologist told him that he would need to be on a blood thinner for 3 months after his ablation. It is unclear whether or not we will get permission to hold his xarelto. If we do not get permission to hold his blood thinner for surgery then we will have to wait to operate until he can come off of it. I told him that should he develop acute cholecystitis during that time. He would generally have to be managed with antibiotics and a gallbladder drain which I described   UMBILICAL HERNIA WITHOUT OBSTRUCTION OR GANGRENE (K42.9) Impression: He has a small fascial defect at the umbilicus. Measures around 1 cm. Nothing contained within it. We can use that fascial defect for optical entry in place a few extra stitches at the end  Mary Sella. [name], [name], FACS General, Bariatric, & Minimally Invasive Surgery [facility], [place]

## 2020-02-02 NOTE — Telephone Encounter (Signed)
Patient with diagnosis of afib on Xarelto for anticoagulation.    Procedure: GALLBLADDER SURGERY  Date of procedure: TBD    CHA2DS2-VASc Score = 0  This indicates a 0.2% annual risk of stroke. The patient's score is based upon: CHF History: 0 HTN History: 0 Diabetes History: 0 Stroke History: 0 Vascular Disease History: 0 Age Score: 0 Gender Score: 0     Patient is s/p afib ablation on 01/02/20. He needs to remain on anticoagulation for 3 months post ablation. Patient should remain on Xarelto ideally uninterrupted until Dec 20, therefore procedure should not be scheduled prior to Dec 23. If surgery needs to occur prior to this, then I would defer to [name].  CrCl 73 ml/min  Per office protocol, patient can hold Xarelto for 2 days prior to procedure   ONLY AFTER Apr 01, 2020

## 2020-02-02 NOTE — Telephone Encounter (Signed)
Pharmacy, can you please comment on how long patient can hold Xarelto for upcoming procedure?  Thank you! 

## 2020-02-05 NOTE — Telephone Encounter (Signed)
Left verbal message for Metta Clines, RMA surgery will need to be postponed per Pharm-D and Pre op team. See notes from Pharm-D [facility]. If any questions please call our office [phone]. Will remove from the pre op call back pool. Will fax notes to requesting office.

## 2020-02-05 NOTE — Telephone Encounter (Signed)
Pre-op covering staff, please see pharmacy noted below about recommending postponing elective procedure until 3 months after after atrial fibrillation ablation. Can you please call requesting surgeon's office and see if procedure can be delayed until after 04/04/2020?  Thank you!

## 2020-02-07 NOTE — Telephone Encounter (Signed)
Spoke with [employer] from [facility]. Left message for Nurse to advise if surgery for Mr. [name] can be postponted until after 04/04/2020. Provided call back number for Nurse to return call

## 2020-02-07 NOTE — Telephone Encounter (Signed)
Spoke with the Nurse  from [facility].  Patient has been advised of plan to postpone surgery until after 12/23 /2020

## 2020-02-07 NOTE — Telephone Encounter (Signed)
Pre-op covering staff, can we please reach back out to surgeon's office to see whether procedure can be delayed until after 04/04/2020?  Thank you!

## 2020-02-08 NOTE — Telephone Encounter (Signed)
° °  Primary Cardiologist: None  Chart reviewed as part of pre-operative protocol coverage. Patient was contacted 02/08/2020 in reference to pre-operative risk assessment for pending surgery as outlined below.  [name] underwent an ablation for management of his persistent atrial fibrillation and will need to delay surgery until after 04/04/20 in an effort to continue uninterrupted anticoagulation for 3 months following ablation. He is scheduled to see [name] 04/01/20 for follow-up, at which time his preoperative status can be addressed.    Pre-op covering staff: - Please add "pre-op clearance" to the appointment notes for upcoming visit with [name] (04/01/20) so provider is aware. - Please contact requesting surgeon's office via preferred method (i.e, phone, fax) to inform them of need for appointment prior to surgery.  Beatriz Stallion, PA-C 02/08/2020, 8:36 AM

## 2020-02-12 ENCOUNTER — Telehealth: Payer: Self-pay

## 2020-02-12 NOTE — Telephone Encounter (Signed)
Spoke to patient and advised that the Protonix is a short term medication after an ablation.   He verbalized understanding

## 2020-02-16 ENCOUNTER — Encounter (HOSPITAL_COMMUNITY): Payer: Self-pay

## 2020-02-20 ENCOUNTER — Telehealth (HOSPITAL_COMMUNITY): Payer: Self-pay | Admitting: *Deleted

## 2020-02-20 NOTE — Telephone Encounter (Signed)
Patient had been noticing irregular heart beats on his apple watch. Performed EKGs and sent via mychart. David Fenton PA confirmed NSR with PACs. Pt monitored over weekend and rhythm settled out he has only had one episode since Friday. He will continue with watchful waiting and call if issues arise.

## 2020-03-04 ENCOUNTER — Other Ambulatory Visit (HOSPITAL_COMMUNITY): Payer: Self-pay | Admitting: *Deleted

## 2020-03-04 MED ORDER — METOPROLOL SUCCINATE ER 25 MG PO TB24
12.5000 mg | ORAL_TABLET | Freq: Every day | ORAL | 3 refills | Status: DC
Start: 2020-03-04 — End: 2020-07-03

## 2020-03-15 ENCOUNTER — Ambulatory Visit: Payer: Self-pay | Admitting: General Surgery

## 2020-04-01 ENCOUNTER — Other Ambulatory Visit: Payer: Self-pay

## 2020-04-01 ENCOUNTER — Encounter: Payer: Self-pay | Admitting: Internal Medicine

## 2020-04-01 ENCOUNTER — Ambulatory Visit (INDEPENDENT_AMBULATORY_CARE_PROVIDER_SITE_OTHER): Payer: 59 | Admitting: Internal Medicine

## 2020-04-01 VITALS — BP 122/72 | HR 54 | Ht 74.0 in | Wt 201.6 lb

## 2020-04-01 DIAGNOSIS — I4819 Other persistent atrial fibrillation: Secondary | ICD-10-CM

## 2020-04-01 DIAGNOSIS — G4733 Obstructive sleep apnea (adult) (pediatric): Secondary | ICD-10-CM | POA: Diagnosis not present

## 2020-04-01 MED ORDER — FLECAINIDE ACETATE 50 MG PO TABS: 50.0000 mg | ORAL_TABLET | Freq: Two times a day (BID) | ORAL | 0 refills | Status: DC

## 2020-04-01 NOTE — Progress Notes (Signed)
   PCP: Joycelyn Rua, MD    [name] is a 54 y.o. male who presents today for routine electrophysiology followup.  Since his recent afib ablation, the patient reports doing very well.  he denies procedure related complications and is pleased with the results of the procedure.  Today, he denies symptoms of palpitations, chest pain, shortness of breath,  lower extremity edema, dizziness, presyncope, or syncope.  The patient is otherwise without complaint today.   Past Medical History:  Diagnosis Date  . Persistent atrial fibrillation (HCC)   . Renal stones   . Sleep apnea    uses CPAP   Past Surgical History:  Procedure Laterality Date  . ATRIAL FIBRILLATION ABLATION N/A 01/02/2020   Procedure: ATRIAL FIBRILLATION ABLATION;  Surgeon: Hillis Range, MD;  [facility]: [facility];  Service: Cardiovascular;  Laterality: N/A;  . CARDIOVERSION N/A 08/08/2019   Procedure: CARDIOVERSION;  Surgeon: Chilton Si, MD;  Location: [facility] ENDOSCOPY;  Service: Cardiovascular;  Laterality: N/A;  . EXTRACORPOREAL SHOCK WAVE LITHOTRIPSY Left 06/08/2019   Procedure: EXTRACORPOREAL SHOCK WAVE LITHOTRIPSY (ESWL);  Surgeon: Noel Christmas, MD;  Location: [facility];  Service: Urology;  Laterality: Left;    ROS- all systems are personally reviewed and negatives except as per HPI above  Current Outpatient Medications  Medication Sig Dispense Refill  . flecainide (TAMBOCOR) 100 MG tablet TAKE 1 TABLET BY MOUTH TWICE A DAY 60 tablet 3  . metoprolol succinate (TOPROL-XL) 25 MG 24 hr tablet Take 0.5 tablets (12.5 mg total) by mouth daily. 15 tablet 3  . prednisoLONE acetate (PRED FORTE) 1 % ophthalmic suspension Place 1 drop into the left eye daily as needed (redness and swelling).     . rivaroxaban (XARELTO) 20 MG TABS tablet Take 1 tablet (20 mg total) by mouth daily with supper. 30 tablet 5  . tamsulosin (FLOMAX) 0.4 MG CAPS capsule Take 0.4 mg by mouth every other day.     No  current facility-administered medications for this visit.    Physical Exam: Vitals:   04/01/20 1230  BP: 122/72  Pulse: (!) 54  SpO2: 98%  Weight: 201 lb 9.6 oz (91.4 kg)  Height: 6\' 2"  (1.88 m)    GEN- The patient is well appearing, alert and oriented x 3 today.   Head- normocephalic, atraumatic Eyes-  Sclera clear, conjunctiva pink Ears- hearing intact Oropharynx- clear Lungs- Clear to ausculation bilaterally, normal work of breathing Heart- Regular rate and rhythm, no murmurs, rubs or gallops, PMI not laterally displaced GI- soft, NT, ND, + BS Extremities- no clubbing, cyanosis, or edema  EKG tracing ordered today is personally reviewed and shows sinus rhythm  Assessment and Plan:  1. Paroxysmal atrial fibrillation/ atrial flutter Doing well s/p ablation chads2vasc score is 0.  Stop xarelto at this time Wean flecainide off over the next 6 weeks Consider stopping toprol on return  2. OSA Follows with Dr Compliance with therapy is advised  Return to see me in 3 months  Earl Gala MD, [facility] 04/01/2020 12:58 PM

## 2020-04-01 NOTE — Patient Instructions (Addendum)
Medication Instructions:  Stop Xarelto  Stop Protonix Reduce your Flecainide 50 mg two times a day, then stop in 6 weeks  *If you need a refill on your cardiac medications before your next appointment, please call your pharmacy*  Lab Work: None ordered.  If you have labs (blood work) drawn today and your tests are completely normal, you will receive your results only by:  MyChart Message (if you have MyChart) OR  A paper copy in the mail If you have any lab test that is abnormal or we need to change your treatment, we will call you to review the results.  Testing/Procedures: None ordered.  Follow-Up: At [facility], you and your health needs are our priority.  As part of our continuing mission to provide you with exceptional heart care, we have created designated Provider Care Teams.  These Care Teams include your primary Cardiologist (physician) and Advanced Practice Providers (APPs -  Physician Assistants and Nurse Practitioners) who all work together to provide you with the care you need, when you need it.  We recommend signing up for the patient portal called "MyChart".  Sign up information is provided on this After Visit Summary.  MyChart is used to connect with patients for Virtual Visits (Telemedicine).  Patients are able to view lab/test results, encounter notes, upcoming appointments, etc.  Non-urgent messages can be sent to your provider as well.   To learn more about what you can do with MyChart, go to [email or web address].    Your next appointment:   Your physician wants you to follow-up in: 07/01/20 at 10:30 am with [name].   Other Instructions:

## 2020-04-04 ENCOUNTER — Encounter (HOSPITAL_COMMUNITY): Payer: Self-pay | Admitting: General Surgery

## 2020-04-04 NOTE — Patient Instructions (Addendum)
DUE TO COVID-19 ONLY ONE VISITOR IS ALLOWED TO COME WITH YOU AND STAY IN THE WAITING ROOM ONLY DURING PRE OP AND PROCEDURE.     COVID SWAB TESTING MUST BE COMPLETED ON:  Monday, Jan. 3, 2021 at 8:45 AM   [address] [place], [place] [address]  (Must self quarantine after testing. Follow instructions on handout.)       Your procedure is scheduled on: Tuesday, Jan. 4, 2021   Report to [facility] Main  Entrance    Report to admitting at 7:30 AM   Call this number if you have problems the morning of surgery [phone]   Do not eat food :After Midnight.   May have liquids until 6:30AM   day of surgery  CLEAR LIQUID DIET  Foods Allowed                                                                     Foods Excluded  Water, Black Coffee and tea, regular and decaf                liquids that you cannot  Plain Jell-O in any flavor  (No red)                                       see through such as: Fruit ices (not with fruit pulp)                                      milk, soups, orange juice              Iced Popsicles (No red)                                      All solid food                                   Apple juices Sports drinks like Gatorade (No red) Lightly seasoned clear broth or consume(fat free) Sugar, honey syrup     Complete one Ensure drink the morning of surgery at 6:30AM  the day of surgery.   Oral Hygiene is also important to reduce your risk of infection.                                    Remember - BRUSH YOUR TEETH THE MORNING OF SURGERY WITH YOUR REGULAR TOOTHPASTE   Do NOT smoke after Midnight   Take these medicines the morning of surgery with A SIP OF WATER: Flecainide, Tamsulosin if needed   May use eyedrops if needed   Bring CPAP mask and tubing the day of procedure.                               You may not have any  metal on your body including  jewelry, and body piercings             Do not wear  lotions, powders,  perfumes/cologne, or deodorant                         Men may shave face and neck.   Do not bring valuables to the hospital. [facility] IS NOT  RESPONSIBLE   FOR VALUABLES.   Contacts, dentures or bridgework may not be worn into surgery.    Patients discharged the day of surgery will not be allowed to drive home.              Please read over the following fact sheets you were given: IF YOU HAVE QUESTIONS ABOUT YOUR PRE OP INSTRUCTIONS PLEASE CALL [phone]   [place] - Preparing for Surgery Before surgery, you can play an important role.  Because skin is not sterile, your skin needs to be as free of germs as possible.  You can reduce the number of germs on your skin by washing with CHG (chlorahexidine gluconate) soap before surgery.  CHG is an antiseptic cleaner which kills germs and bonds with the skin to continue killing germs even after washing. Please DO NOT use if you have an allergy to CHG or antibacterial soaps.  If your skin becomes reddened/irritated stop using the CHG and inform your nurse when you arrive at Short Stay. Do not shave (including legs and underarms) for at least 48 hours prior to the first CHG shower.  You may shave your face/neck.  Please follow these instructions carefully:  1.  Shower with CHG Soap the night before surgery and the  morning of surgery.  2.  If you choose to wash your hair, wash your hair first as usual with your normal  shampoo.  3.  After you shampoo, rinse your hair and body thoroughly to remove the shampoo.                             4.  Use CHG as you would any other liquid soap.  You can apply chg directly to the skin and wash.  Gently with a scrungie or clean washcloth.  5.  Apply the CHG Soap to your body ONLY FROM THE NECK DOWN.   Do   not use on face/ open                           Wound or open sores. Avoid contact with eyes, ears mouth and   genitals (private parts).                       Wash face,  Genitals (private parts)  with your normal soap.             6.  Wash thoroughly, paying special attention to the area where your    surgery  will be performed.  7.  Thoroughly rinse your body with warm water from the neck down.  8.  DO NOT shower/wash with your normal soap after using and rinsing off the CHG Soap.                9.  Pat yourself dry with a clean towel.            10.  Wear clean pajamas.  11.  Place clean sheets on your bed the night of your first shower and do not  sleep with pets. Day of Surgery : Do not apply any lotions/deodorants the morning of surgery.  Please wear clean clothes to the hospital/surgery center.  FAILURE TO FOLLOW THESE INSTRUCTIONS MAY RESULT IN THE CANCELLATION OF YOUR SURGERY  PATIENT SIGNATURE_________________________________  NURSE SIGNATURE__________________________________  ________________________________________________________________________

## 2020-04-04 NOTE — Progress Notes (Addendum)
COVID Vaccine Completed: Yes Date COVID Vaccine completed:x2 COVID vaccine manufacturer: Pfizer     PCP - Joycelyn Rua, MD Cardiologist - Hillis Range MD, last office visit 04/01/20 in epic  Chest x-ray - N/A EKG - 04/01/20 in epic Stress Test - N/A ECHO - 06/08/19 in epic Cardiac Cath - N/A Pacemaker/ICD device last checked:N/A  Sleep Study - 06/2019 in epic CPAP - Yes  Fasting Blood Sugar - N/A Checks Blood Sugar _N/A____ times a day  Blood Thinner Instructions: N/A Aspirin Instructions: N/A Last Dose: N/A  Activity level: Able to exercise without symptoms     Anesthesia review: Afib,OSA  Patient denies shortness of breath, fever, cough and chest pain at PAT appointment   Patient verbalized understanding of instructions that were given to them at the PAT appointment. Patient was also instructed that they will need to review over the PAT instructions again at home before surgery.

## 2020-04-08 ENCOUNTER — Other Ambulatory Visit: Payer: Self-pay

## 2020-04-08 ENCOUNTER — Encounter (HOSPITAL_COMMUNITY): Payer: Self-pay | Admitting: General Surgery

## 2020-04-10 ENCOUNTER — Telehealth: Payer: Self-pay | Admitting: Internal Medicine

## 2020-04-10 NOTE — Telephone Encounter (Signed)
Called patient and answered all questions regarding weaning flecainide and up coming surgery.   Patient verbalized understanding.

## 2020-04-10 NOTE — Telephone Encounter (Signed)
Pt c/o medication issue:  1. Name of Medication: flecainide (TAMBOCOR) 50 MG tablet  2. How are you currently taking this medication (dosage and times per day)? As needed.  3. Are you having a reaction (difficulty breathing--STAT)? No.  4. What is your medication issue? Patient states that he was told by [name] to decrease his dosage of this mediation after the holidays, he says that he has a surgery scheduled on 04/16/2020 and wants to know if its okay for him to take this medication before his surgery. Please advise.

## 2020-04-15 ENCOUNTER — Other Ambulatory Visit (HOSPITAL_COMMUNITY)
Admission: RE | Admit: 2020-04-15 | Discharge: 2020-04-15 | Disposition: A | Discharge status: Home / Self Care | Payer: 59 | Source: Ambulatory Visit | Attending: General Surgery | Admitting: General Surgery

## 2020-04-15 ENCOUNTER — Encounter (HOSPITAL_COMMUNITY)
Admission: RE | Admit: 2020-04-15 | Discharge: 2020-04-15 | Disposition: A | Discharge status: Home / Self Care | Payer: 59 | Source: Ambulatory Visit | Attending: General Surgery | Admitting: General Surgery

## 2020-04-15 ENCOUNTER — Other Ambulatory Visit: Payer: Self-pay

## 2020-04-15 DIAGNOSIS — K8020 Calculus of gallbladder without cholecystitis without obstruction: Secondary | ICD-10-CM | POA: Insufficient documentation

## 2020-04-15 DIAGNOSIS — U071 COVID-19: Secondary | ICD-10-CM | POA: Diagnosis not present

## 2020-04-15 LAB — CBC WITH DIFFERENTIAL/PLATELET
Abs Immature Granulocytes: 0.01 10*3/uL (ref 0.00–0.07)
Basophils Absolute: 0 10*3/uL (ref 0.0–0.1)
Basophils Relative: 1 %
Eosinophils Absolute: 0.1 10*3/uL (ref 0.0–0.5)
Eosinophils Relative: 1 %
HCT: 40.1 % (ref 39.0–52.0)
Hemoglobin: 12.9 g/dL — ABNORMAL LOW (ref 13.0–17.0)
Immature Granulocytes: 0 %
Lymphocytes Relative: 30 %
Lymphs Abs: 1.5 10*3/uL (ref 0.7–4.0)
MCH: 28.4 pg (ref 26.0–34.0)
MCHC: 32.2 g/dL (ref 30.0–36.0)
MCV: 88.3 fL (ref 80.0–100.0)
Monocytes Absolute: 0.5 10*3/uL (ref 0.1–1.0)
Monocytes Relative: 9 %
Neutro Abs: 3 10*3/uL (ref 1.7–7.7)
Neutrophils Relative %: 59 %
Platelets: 186 10*3/uL (ref 150–400)
RBC: 4.54 MIL/uL (ref 4.22–5.81)
RDW: 12.6 % (ref 11.5–15.5)
WBC: 5 10*3/uL (ref 4.0–10.5)
nRBC: 0 % (ref 0.0–0.2)

## 2020-04-15 LAB — COMPREHENSIVE METABOLIC PANEL
ALT: 21 U/L (ref 0–44)
AST: 22 U/L (ref 15–41)
Albumin: 4.5 g/dL (ref 3.5–5.0)
Alkaline Phosphatase: 59 U/L (ref 38–126)
Anion gap: 10 (ref 5–15)
BUN: 15 mg/dL (ref 6–20)
CO2: 26 mmol/L (ref 22–32)
Calcium: 9.1 mg/dL (ref 8.9–10.3)
Chloride: 104 mmol/L (ref 98–111)
Creatinine, Ser: 1.13 mg/dL (ref 0.61–1.24)
GFR, Estimated: >60 mL/min (ref >60)
Glucose, Bld: 105 mg/dL — ABNORMAL HIGH (ref 70–99)
Potassium: 4.3 mmol/L (ref 3.5–5.1)
Sodium: 140 mmol/L (ref 135–145)
Total Bilirubin: 0.7 mg/dL (ref 0.3–1.2)
Total Protein: 7.3 g/dL (ref 6.5–8.1)

## 2020-04-15 LAB — SARS CORONAVIRUS 2 (TAT 6-24 HRS): SARS Coronavirus 2: POSITIVE — AB

## 2020-04-16 ENCOUNTER — Encounter (HOSPITAL_COMMUNITY): Payer: Self-pay | Admitting: Anesthesiology

## 2020-04-16 ENCOUNTER — Ambulatory Visit (HOSPITAL_COMMUNITY): Admission: RE | Admit: 2020-04-16 | Payer: 59 | Source: Home / Self Care | Admitting: General Surgery

## 2020-04-16 HISTORY — DX: Personal history of urinary calculi: Z87.442

## 2020-04-16 HISTORY — DX: Calculus of gallbladder without cholecystitis without obstruction: K80.20

## 2020-04-16 SURGERY — LAPAROSCOPIC CHOLECYSTECTOMY WITH INTRAOPERATIVE CHOLANGIOGRAM
Anesthesia: General

## 2020-04-30 ENCOUNTER — Other Ambulatory Visit: Payer: Self-pay | Admitting: Internal Medicine

## 2020-05-02 ENCOUNTER — Encounter (HOSPITAL_COMMUNITY): Payer: Self-pay

## 2020-05-02 NOTE — Progress Notes (Addendum)
PCP - Joycelyn Rua, MD Cardiologist - Hillis Range lov 04-01-20 epic  PPM/ICD -  Device Orders -  Rep Notified -   Chest x-ray -  EKG - 04-01-20 Stress Test -  ECHO - 06-08-19  Cardiac Cath -  Cardiac CT-12-27-19  CTA chest - 12-28-19   Sleep Study - 06-21-19  CPAP - yes  Fasting Blood Sugar -  Checks Blood Sugar _____ times a day  Blood Thinner Instructions: Aspirin Instructions:  [facility] - PRE-SURGERY Ensure   COVID TEST- COVID POSITIVE 04-15-2020 IN EPIC  Activity--can walk a flight of stairs without sob Anesthesia review: osa, a fib/ablation  Patient denies shortness of breath, fever, cough and chest pain at PAT appointment   All instructions explained to the patient, with a verbal understanding of the material. Patient agrees to go over the instructions while at home for a better understanding. Patient also instructed to self quarantine after being tested for COVID-19. The opportunity to ask questions was provided.

## 2020-05-02 NOTE — Patient Instructions (Addendum)
DUE TO COVID-19 ONLY ONE VISITOR IS ALLOWED TO COME WITH YOU AND STAY IN THE WAITING ROOM ONLY DURING PRE OP AND PROCEDURE.     COVID SWAB TESTING MUST BE COMPLETED ON:     [address] [place], [place] [address]  (Must self quarantine after testing. Follow instructions on handout.)       Your procedure is scheduled on: 05-16-20   Report to [facility] Main  Entrance    Report to admitting at 7:30 AM   Call this number if you have problems the morning of surgery [phone]   Do not eat food :After Midnight.   May have liquids until 6:30AM   day of surgery  CLEAR LIQUID DIET  Foods Allowed                                                                       Water, Black Coffee and tea, regular and decaf               Plain Jell-O in any flavor  (No red)                                      Fruit ices (not with fruit pulp)                                                   Iced Popsicles (No red)                                                                   Apple juices Sports drinks like Gatorade (No red) Lightly seasoned clear broth or consume(fat free) Sugar, honey syrup     Complete one Ensure drink the morning of surgery at 6:30AM  the day of surgery.   Oral Hygiene is also important to reduce your risk of infection.                                     Remember - BRUSH YOUR TEETH THE MORNING OF SURGERY WITH YOUR REGULAR TOOTHPASTE   Do NOT smoke after Midnight   Take these medicines the morning of surgery with A SIP OF WATER: Metoprolol,  Flecainide, Tamsulosin if needed             May use eyedrops if needed   Bring CPAP mask and tubing the day of procedure.                               You may not have any metal on your body including  jewelry, and body piercings  Do not wear  lotions, powders, perfumes/cologne, or deodorant                         Men may shave face and neck.   Do not bring valuables to the hospital. [facility] IS  NOT  RESPONSIBLE   FOR VALUABLES.   Contacts, dentures or bridgework may not be worn into surgery.    Patients discharged the day of surgery will not be allowed to drive home.              Please read over the following fact sheets you were given: IF YOU HAVE QUESTIONS ABOUT YOUR PRE OP INSTRUCTIONS PLEASE CALL [phone]   [place] - Preparing for Surgery Before surgery, you can play an important role.  Because skin is not sterile, your skin needs to be as free of germs as possible.  You can reduce the number of germs on your skin by washing with CHG (chlorahexidine gluconate) soap before surgery.  CHG is an antiseptic cleaner which kills germs and bonds with the skin to continue killing germs even after washing. Please DO NOT use if you have an allergy to CHG or antibacterial soaps.  If your skin becomes reddened/irritated stop using the CHG and inform your nurse when you arrive at Short Stay. Do not shave (including legs and underarms) for at least 48 hours prior to the first CHG shower.  You may shave your face/neck.  Please follow these instructions carefully:  1.  Shower with CHG Soap the night before surgery and the  morning of surgery.  2.  If you choose to wash your hair, wash your hair first as usual with your normal  shampoo.  3.  After you shampoo, rinse your hair and body thoroughly to remove the shampoo.                             4.  Use CHG as you would any other liquid soap.  You can apply chg directly to the skin and wash.  Gently with a scrungie or clean washcloth.  5.  Apply the CHG Soap to your body ONLY FROM THE NECK DOWN.   Do   not use on face/ open                           Wound or open sores. Avoid contact with eyes, ears mouth and   genitals (private parts).                       Wash face,  Genitals (private parts) with your normal soap.             6.  Wash thoroughly, paying special attention to the area where your    surgery  will be performed.  7.   Thoroughly rinse your body with warm water from the neck down.  8.  DO NOT shower/wash with your normal soap after using and rinsing off the CHG Soap.                9.  Pat yourself dry with a clean towel.            10.  Wear clean pajamas.            11.  Place clean sheets on your bed the night of your first  shower and do not  sleep with pets. Day of Surgery : Do not apply any lotions/deodorants the morning of surgery.  Please wear clean clothes to the hospital/surgery center.  FAILURE TO FOLLOW THESE INSTRUCTIONS MAY RESULT IN THE CANCELLATION OF YOUR SURGERY  PATIENT SIGNATURE_________________________________  NURSE SIGNATURE__________________________________  ________________________________________________________________________

## 2020-05-06 ENCOUNTER — Other Ambulatory Visit: Payer: Self-pay

## 2020-05-06 ENCOUNTER — Encounter (HOSPITAL_COMMUNITY): Payer: Self-pay

## 2020-05-06 ENCOUNTER — Encounter (HOSPITAL_COMMUNITY)
Admission: RE | Admit: 2020-05-06 | Discharge: 2020-05-06 | Disposition: A | Discharge status: Home / Self Care | Payer: 59 | Source: Ambulatory Visit | Attending: General Surgery | Admitting: General Surgery

## 2020-05-06 DIAGNOSIS — Z7901 Long term (current) use of anticoagulants: Secondary | ICD-10-CM | POA: Diagnosis not present

## 2020-05-06 DIAGNOSIS — I4891 Unspecified atrial fibrillation: Secondary | ICD-10-CM | POA: Insufficient documentation

## 2020-05-06 DIAGNOSIS — Z79899 Other long term (current) drug therapy: Secondary | ICD-10-CM | POA: Diagnosis not present

## 2020-05-06 DIAGNOSIS — K8020 Calculus of gallbladder without cholecystitis without obstruction: Secondary | ICD-10-CM | POA: Insufficient documentation

## 2020-05-06 DIAGNOSIS — Z01812 Encounter for preprocedural laboratory examination: Secondary | ICD-10-CM | POA: Insufficient documentation

## 2020-05-06 DIAGNOSIS — G4730 Sleep apnea, unspecified: Secondary | ICD-10-CM | POA: Insufficient documentation

## 2020-05-06 HISTORY — DX: Cardiac arrhythmia, unspecified: I49.9

## 2020-05-06 LAB — CBC
HCT: 44.1 % (ref 39.0–52.0)
Hemoglobin: 14.2 g/dL (ref 13.0–17.0)
MCH: 28.4 pg (ref 26.0–34.0)
MCHC: 32.2 g/dL (ref 30.0–36.0)
MCV: 88.2 fL (ref 80.0–100.0)
Platelets: 257 10*3/uL (ref 150–400)
RBC: 5 MIL/uL (ref 4.22–5.81)
RDW: 12.6 % (ref 11.5–15.5)
WBC: 7.2 10*3/uL (ref 4.0–10.5)
nRBC: 0 % (ref 0.0–0.2)

## 2020-05-06 LAB — BASIC METABOLIC PANEL
Anion gap: 8 (ref 5–15)
BUN: 20 mg/dL (ref 6–20)
CO2: 26 mmol/L (ref 22–32)
Calcium: 9.7 mg/dL (ref 8.9–10.3)
Chloride: 106 mmol/L (ref 98–111)
Creatinine, Ser: 1.22 mg/dL (ref 0.61–1.24)
GFR, Estimated: >60 mL/min (ref >60)
Glucose, Bld: 102 mg/dL — ABNORMAL HIGH (ref 70–99)
Potassium: 5.2 mmol/L — ABNORMAL HIGH (ref 3.5–5.1)
Sodium: 140 mmol/L (ref 135–145)

## 2020-05-07 NOTE — Anesthesia Preprocedure Evaluation (Addendum)
Anesthesia Evaluation  Patient identified by MRN, date of birth, ID band Patient awake    Reviewed: Allergy & Precautions, NPO status , Patient's Chart, lab work & pertinent test results  History of Anesthesia Complications Negative for: history of anesthetic complications  Airway Mallampati: II  TM Distance: <3 FB Neck ROM: Full  Mouth opening: Limited Mouth Opening  Dental no notable dental hx.    Pulmonary sleep apnea and Continuous Positive Airway Pressure Ventilation ,    Pulmonary exam normal        Cardiovascular Normal cardiovascular exam+ dysrhythmias (s/p ablation) Atrial Fibrillation   TTE 06/08/2019: EF 60-65%, valves ok   Neuro/Psych negative neurological ROS  negative psych ROS   GI/Hepatic negative GI ROS, Neg liver ROS,   Endo/Other  negative endocrine ROS  Renal/GU negative Renal ROS  negative genitourinary   Musculoskeletal negative musculoskeletal ROS (+)   Abdominal   Peds  Hematology negative hematology ROS (+)   Anesthesia Other Findings Day of surgery medications reviewed with patient.  Reproductive/Obstetrics negative OB ROS                            Anesthesia Physical Anesthesia Plan  ASA: II  Anesthesia Plan: General   Post-op Pain Management:    Induction: Intravenous  PONV Risk Score and Plan: 2 and Treatment may vary due to age or medical condition, Ondansetron, Dexamethasone and Midazolam  Airway Management Planned: Oral ETT  Additional Equipment: None  Intra-op Plan:   Post-operative Plan: Extubation in OR  Informed Consent: I have reviewed the patients History and Physical, chart, labs and discussed the procedure including the risks, benefits and alternatives for the proposed anesthesia with the patient or authorized representative who has indicated his/her understanding and acceptance.     Dental advisory given  Plan Discussed with:  CRNA  Anesthesia Plan Comments: (See PAT note 05/06/2020, Jodell Cipro, PA-C)      Anesthesia Quick Evaluation

## 2020-05-07 NOTE — Progress Notes (Signed)
Anesthesia Chart Review   Case: [id number] Date/Time: 05/16/20 0915   Procedure: LAPAROSCOPIC CHOLECYSTECTOMY WITH INTRAOPERATIVE CHOLANGIOGRAM (N/A )   Anesthesia type: General   Pre-op diagnosis: SYMPTOMATIC CHOLELITHIASIS   Location: [facility] ROOM 01 / [facility]   Surgeons: Gaynelle Adu, MD      DISCUSSION:55 y.o. never smoker with h/o sleep apnea, a-fib s/p ablation, symptomatic cholelithiasis scheduled for above procedure 05/16/20 with Dr. Gaynelle Adu.   Pt last see by cardiology 04/01/2020. Advised to stop Xarelto s/p ablation, weaning flecainide over 6 week period. 3 month follow up recommended.   Anticipate pt can proceed with planned procedure barring acute status change.   VS: BP 130/81   Pulse 63   Temp 36.7 C (Oral)   Resp 16   Ht 6\' 2"  (1.88 m)   Wt 87.1 kg   SpO2 100%   BMI 24.65 kg/m   PROVIDERS: , MD is PCP   Joycelyn Rua, MD is Cardiologist  LABS: Labs reviewed: Acceptable for surgery. (all labs ordered are listed, but only abnormal results are displayed)  Labs Reviewed  BASIC METABOLIC PANEL - Abnormal; Notable for the following components:      Result Value   Potassium 5.2 (*)    Glucose, Bld 102 (*)    All other components within normal limits  CBC     IMAGES:   EKG: 04/01/2020 Rate 54 bpm  Sinus rhythm   CV: Echo 06/08/2019 IMPRESSIONS    1. Left ventricular ejection fraction, by estimation, is 60 to 65%. The  left ventricle has normal function. The left ventricle has no regional  wall motion abnormalities. LV diastolic filing cannot be assessed due to  underlying atrial fibrillation.  2. Right ventricular systolic function is normal. The right ventricular  size is normal. Tricuspid regurgitation signal is inadequate for assessing  PA pressure.  3. The mitral valve is normal in structure and function. No evidence of  mitral valve regurgitation. No evidence of mitral stenosis.  4. The aortic valve is tricuspid. Aortic valve  regurgitation is not  visualized. Mild aortic valve sclerosis is present, with no evidence of  aortic valve stenosis.  5. The inferior vena cava is dilated in size with >50% respiratory  variability, suggesting right atrial pressure of 8 mmHg.  Past Medical History:  Diagnosis Date  . Dysrhythmia    hx a fib with ablation  . Gallstones   . History of kidney stones   . Persistent atrial fibrillation (HCC)   . Sleep apnea    uses CPAP    Past Surgical History:  Procedure Laterality Date  . ATRIAL FIBRILLATION ABLATION N/A 01/02/2020   Procedure: ATRIAL FIBRILLATION ABLATION;  Surgeon: 01/04/2020, MD;  [facility] LAB;  Service: Cardiovascular;  Laterality: N/A;  . CARDIOVERSION N/A 08/08/2019   Procedure: CARDIOVERSION;  Surgeon: 08/10/2019, MD;  Location: [facility] ENDOSCOPY;  Service: Cardiovascular;  Laterality: N/A;  . CYST EXCISION     removed back of leg  . EXTRACORPOREAL SHOCK WAVE LITHOTRIPSY Left 06/08/2019   Procedure: EXTRACORPOREAL SHOCK WAVE LITHOTRIPSY (ESWL);  Surgeon: 06/10/2019, MD;  Location: [facility];  Service: Urology;  Laterality: Left;    MEDICATIONS: . cholecalciferol (VITAMIN D3) 25 MCG (1000 UNIT) tablet  . flecainide (TAMBOCOR) 50 MG tablet  . metoprolol succinate (TOPROL-XL) 25 MG 24 hr tablet  . prednisoLONE acetate (PRED FORTE) 1 % ophthalmic suspension  . tamsulosin (FLOMAX) 0.4 MG CAPS capsule  . zinc gluconate 50 MG  tablet   No current facility-administered medications for this encounter.   Jodell Cipro, PA-C [facility] Pre-Surgical Testing [phone]

## 2020-05-13 ENCOUNTER — Other Ambulatory Visit (HOSPITAL_COMMUNITY): Payer: 59

## 2020-05-16 ENCOUNTER — Ambulatory Visit (HOSPITAL_COMMUNITY): Payer: 59 | Admitting: Physician Assistant

## 2020-05-16 ENCOUNTER — Ambulatory Visit (HOSPITAL_COMMUNITY): Payer: 59

## 2020-05-16 ENCOUNTER — Ambulatory Visit (HOSPITAL_COMMUNITY)
Admission: RE | Admit: 2020-05-16 | Discharge: 2020-05-16 | Disposition: A | Discharge status: Home / Self Care | Payer: 59 | Attending: General Surgery | Admitting: General Surgery

## 2020-05-16 ENCOUNTER — Encounter (HOSPITAL_COMMUNITY)
Admission: RE | Disposition: A | Discharge status: Home / Self Care | Payer: Self-pay | Source: Home / Self Care | Attending: General Surgery

## 2020-05-16 ENCOUNTER — Ambulatory Visit (HOSPITAL_COMMUNITY): Payer: 59 | Admitting: Certified Registered Nurse Anesthetist

## 2020-05-16 ENCOUNTER — Encounter (HOSPITAL_COMMUNITY): Payer: Self-pay | Admitting: General Surgery

## 2020-05-16 DIAGNOSIS — Z419 Encounter for procedure for purposes other than remedying health state, unspecified: Secondary | ICD-10-CM

## 2020-05-16 DIAGNOSIS — Z8616 Personal history of COVID-19: Secondary | ICD-10-CM | POA: Diagnosis not present

## 2020-05-16 DIAGNOSIS — Z8249 Family history of ischemic heart disease and other diseases of the circulatory system: Secondary | ICD-10-CM | POA: Diagnosis not present

## 2020-05-16 DIAGNOSIS — I4821 Permanent atrial fibrillation: Secondary | ICD-10-CM | POA: Diagnosis not present

## 2020-05-16 DIAGNOSIS — K8010 Calculus of gallbladder with chronic cholecystitis without obstruction: Secondary | ICD-10-CM | POA: Diagnosis not present

## 2020-05-16 DIAGNOSIS — K828 Other specified diseases of gallbladder: Secondary | ICD-10-CM | POA: Diagnosis not present

## 2020-05-16 DIAGNOSIS — T884XXA Failed or difficult intubation, initial encounter: Secondary | ICD-10-CM

## 2020-05-16 DIAGNOSIS — Z87442 Personal history of urinary calculi: Secondary | ICD-10-CM | POA: Insufficient documentation

## 2020-05-16 DIAGNOSIS — Z7901 Long term (current) use of anticoagulants: Secondary | ICD-10-CM | POA: Diagnosis not present

## 2020-05-16 HISTORY — PX: CHOLECYSTECTOMY: SHX55

## 2020-05-16 SURGERY — LAPAROSCOPIC CHOLECYSTECTOMY WITH INTRAOPERATIVE CHOLANGIOGRAM
Anesthesia: General | Site: Abdomen

## 2020-05-16 MED ORDER — ROCURONIUM BROMIDE 10 MG/ML (PF) SYRINGE
PREFILLED_SYRINGE | INTRAVENOUS | Status: AC
  Filled 2020-05-16: qty 10

## 2020-05-16 MED ORDER — FENTANYL CITRATE (PF) 100 MCG/2ML IJ SOLN
INTRAMUSCULAR | Status: DC | PRN
  Administered 2020-05-16: 100 ug via INTRAVENOUS
  Administered 2020-05-16 (×2): 50 ug via INTRAVENOUS

## 2020-05-16 MED ORDER — OXYCODONE HCL 5 MG PO TABS
5.0000 mg | ORAL_TABLET | Freq: Once | ORAL | Status: AC | PRN
  Administered 2020-05-16: 5 mg via ORAL

## 2020-05-16 MED ORDER — ACETAMINOPHEN 500 MG PO TABS: 1000.0000 mg | ORAL_TABLET | Freq: Once | ORAL | Status: AC

## 2020-05-16 MED ORDER — SUGAMMADEX SODIUM 200 MG/2ML IV SOLN
INTRAVENOUS | Status: DC | PRN
  Administered 2020-05-16: 200 mg via INTRAVENOUS

## 2020-05-16 MED ORDER — ONDANSETRON HCL 4 MG/2ML IJ SOLN
INTRAMUSCULAR | Status: AC
  Filled 2020-05-16: qty 2

## 2020-05-16 MED ORDER — FENTANYL CITRATE (PF) 100 MCG/2ML IJ SOLN
INTRAMUSCULAR | Status: AC
  Filled 2020-05-16: qty 2

## 2020-05-16 MED ORDER — 0.9 % SODIUM CHLORIDE (POUR BTL) OPTIME
TOPICAL | Status: DC | PRN
  Administered 2020-05-16: 1000 mL

## 2020-05-16 MED ORDER — PROPOFOL 10 MG/ML IV BOLUS
INTRAVENOUS | Status: AC
  Filled 2020-05-16: qty 20

## 2020-05-16 MED ORDER — ONDANSETRON HCL 4 MG/2ML IJ SOLN
INTRAMUSCULAR | Status: DC | PRN
  Administered 2020-05-16: 4 mg via INTRAVENOUS

## 2020-05-16 MED ORDER — OXYCODONE HCL 5 MG/5ML PO SOLN: 5.0000 mg | Freq: Once | ORAL | Status: AC | PRN

## 2020-05-16 MED ORDER — ENSURE PRE-SURGERY PO LIQD
296.0000 mL | Freq: Once | ORAL | Status: DC
  Filled 2020-05-16: qty 296

## 2020-05-16 MED ORDER — OXYCODONE HCL 5 MG PO TABS: 5.0000 mg | ORAL_TABLET | Freq: Four times a day (QID) | ORAL | 0 refills | Status: DC | PRN

## 2020-05-16 MED ORDER — INDOCYANINE GREEN 25 MG IV SOLR: 7.5000 mg | Freq: Once | INTRAVENOUS | Status: DC

## 2020-05-16 MED ORDER — ESMOLOL HCL 100 MG/10ML IV SOLN: INTRAVENOUS | Status: DC | PRN

## 2020-05-16 MED ORDER — ROCURONIUM BROMIDE 10 MG/ML (PF) SYRINGE
PREFILLED_SYRINGE | INTRAVENOUS | Status: DC | PRN
  Administered 2020-05-16: 20 mg via INTRAVENOUS
  Administered 2020-05-16 (×2): 10 mg via INTRAVENOUS
  Administered 2020-05-16: 60 mg via INTRAVENOUS

## 2020-05-16 MED ORDER — FENTANYL CITRATE (PF) 100 MCG/2ML IJ SOLN: 25.0000 ug | INTRAMUSCULAR | Status: DC | PRN

## 2020-05-16 MED ORDER — IOHEXOL 300 MG/ML  SOLN
INTRAMUSCULAR | Status: DC | PRN
  Administered 2020-05-16: 10 mL

## 2020-05-16 MED ORDER — PROMETHAZINE HCL 25 MG/ML IJ SOLN: 6.2500 mg | INTRAMUSCULAR | Status: DC | PRN

## 2020-05-16 MED ORDER — ESMOLOL HCL 100 MG/10ML IV SOLN
INTRAVENOUS | Status: AC
  Filled 2020-05-16: qty 10

## 2020-05-16 MED ORDER — LIDOCAINE 2% (20 MG/ML) 5 ML SYRINGE
INTRAMUSCULAR | Status: DC | PRN
  Administered 2020-05-16: 100 mg via INTRAVENOUS

## 2020-05-16 MED ORDER — FENTANYL CITRATE (PF) 100 MCG/2ML IJ SOLN
INTRAMUSCULAR | Status: AC
  Administered 2020-05-16: 50 ug via INTRAVENOUS
  Filled 2020-05-16: qty 2

## 2020-05-16 MED ORDER — MIDAZOLAM HCL 2 MG/2ML IJ SOLN
INTRAMUSCULAR | Status: AC
  Filled 2020-05-16: qty 2

## 2020-05-16 MED ORDER — BUPIVACAINE-EPINEPHRINE 0.25% -1:200000 IJ SOLN
INTRAMUSCULAR | Status: DC | PRN
  Administered 2020-05-16: 30 mL

## 2020-05-16 MED ORDER — PHENYLEPHRINE HCL-NACL 10-0.9 MG/250ML-% IV SOLN
INTRAVENOUS | Status: DC | PRN
  Administered 2020-05-16: 50 ug/min via INTRAVENOUS

## 2020-05-16 MED ORDER — KETOROLAC TROMETHAMINE 15 MG/ML IJ SOLN
15.0000 mg | Freq: Once | INTRAMUSCULAR | Status: AC
  Administered 2020-05-16: 15 mg via INTRAVENOUS

## 2020-05-16 MED ORDER — CHLORHEXIDINE GLUCONATE CLOTH 2 % EX PADS: 6.0000 | MEDICATED_PAD | Freq: Once | CUTANEOUS | Status: DC

## 2020-05-16 MED ORDER — ESMOLOL HCL 100 MG/10ML IV SOLN
INTRAVENOUS | Status: DC | PRN
  Administered 2020-05-16: 30 mg via INTRAVENOUS

## 2020-05-16 MED ORDER — ORAL CARE MOUTH RINSE: 15.0000 mL | Freq: Once | OROMUCOSAL | Status: AC

## 2020-05-16 MED ORDER — ACETAMINOPHEN 500 MG PO TABS
1000.0000 mg | ORAL_TABLET | ORAL | Status: AC
  Administered 2020-05-16: 1000 mg via ORAL
  Filled 2020-05-16: qty 2

## 2020-05-16 MED ORDER — DEXAMETHASONE SODIUM PHOSPHATE 10 MG/ML IJ SOLN
INTRAMUSCULAR | Status: AC
  Filled 2020-05-16: qty 1

## 2020-05-16 MED ORDER — METOPROLOL TARTRATE 5 MG/5ML IV SOLN
INTRAVENOUS | Status: DC | PRN
  Administered 2020-05-16 (×5): 1 mg via INTRAVENOUS

## 2020-05-16 MED ORDER — LACTATED RINGERS IR SOLN
Status: DC | PRN
  Administered 2020-05-16: 1000 mL

## 2020-05-16 MED ORDER — ACETAMINOPHEN 500 MG PO TABS: 1000.0000 mg | ORAL_TABLET | Freq: Three times a day (TID) | ORAL | 0 refills | Status: AC

## 2020-05-16 MED ORDER — BUPIVACAINE-EPINEPHRINE (PF) 0.25% -1:200000 IJ SOLN
INTRAMUSCULAR | Status: AC
  Filled 2020-05-16: qty 30

## 2020-05-16 MED ORDER — PHENYLEPHRINE 40 MCG/ML (10ML) SYRINGE FOR IV PUSH (FOR BLOOD PRESSURE SUPPORT)
PREFILLED_SYRINGE | INTRAVENOUS | Status: DC | PRN
  Administered 2020-05-16: 80 ug via INTRAVENOUS
  Administered 2020-05-16 (×2): 120 ug via INTRAVENOUS

## 2020-05-16 MED ORDER — LACTATED RINGERS IV SOLN: INTRAVENOUS | Status: DC

## 2020-05-16 MED ORDER — DEXAMETHASONE SODIUM PHOSPHATE 4 MG/ML IJ SOLN: 4.0000 mg | INTRAMUSCULAR | Status: DC

## 2020-05-16 MED ORDER — SODIUM CHLORIDE 0.9 % IV SOLN
2.0000 g | INTRAVENOUS | Status: AC
  Administered 2020-05-16: 2 g via INTRAVENOUS
  Filled 2020-05-16: qty 2

## 2020-05-16 MED ORDER — CHLORHEXIDINE GLUCONATE 0.12 % MT SOLN
15.0000 mL | Freq: Once | OROMUCOSAL | Status: AC
  Administered 2020-05-16: 15 mL via OROMUCOSAL

## 2020-05-16 MED ORDER — OXYCODONE HCL 5 MG PO TABS
ORAL_TABLET | ORAL | Status: AC
  Filled 2020-05-16: qty 1

## 2020-05-16 MED ORDER — METOPROLOL TARTRATE 5 MG/5ML IV SOLN
INTRAVENOUS | Status: AC
  Filled 2020-05-16: qty 5

## 2020-05-16 MED ORDER — GLUCAGON HCL RDNA (DIAGNOSTIC) 1 MG IJ SOLR
INTRAMUSCULAR | Status: AC
  Filled 2020-05-16: qty 1

## 2020-05-16 MED ORDER — MIDAZOLAM HCL 5 MG/5ML IJ SOLN
INTRAMUSCULAR | Status: DC | PRN
  Administered 2020-05-16: 2 mg via INTRAVENOUS

## 2020-05-16 MED ORDER — DEXAMETHASONE SODIUM PHOSPHATE 10 MG/ML IJ SOLN
INTRAMUSCULAR | Status: DC | PRN
  Administered 2020-05-16: 8 mg via INTRAVENOUS

## 2020-05-16 MED ORDER — PROPOFOL 10 MG/ML IV BOLUS
INTRAVENOUS | Status: DC | PRN
  Administered 2020-05-16: 180 mg via INTRAVENOUS

## 2020-05-16 SURGICAL SUPPLY — 58 items
ADH SKN CLS APL DERMABOND .7 (GAUZE/BANDAGES/DRESSINGS)
APL PRP STRL LF DISP 70% ISPRP (MISCELLANEOUS) ×1
APL SKNCLS STERI-STRIP NONHPOA (GAUZE/BANDAGES/DRESSINGS)
APL SRG 38 LTWT LNG FL B (MISCELLANEOUS)
APPLICATOR ARISTA FLEXITIP XL (MISCELLANEOUS) IMPLANT
APPLIER CLIP 5 13 M/L LIGAMAX5 (MISCELLANEOUS)
APPLIER CLIP ROT 10 11.4 M/L (STAPLE) ×2
APR CLP MED LRG 11.4X10 (STAPLE) ×1
APR CLP MED LRG 5 ANG JAW (MISCELLANEOUS)
BAG SPEC RTRVL 10 TROC 200 (ENDOMECHANICALS) ×1
BENZOIN TINCTURE PRP APPL 2/3 (GAUZE/BANDAGES/DRESSINGS) IMPLANT
BNDG ADH 1X3 SHEER STRL LF (GAUZE/BANDAGES/DRESSINGS) ×8 IMPLANT
BNDG ADH THN 3X1 STRL LF (GAUZE/BANDAGES/DRESSINGS) ×4
CABLE HIGH FREQUENCY MONO STRZ (ELECTRODE) ×2 IMPLANT
CHLORAPREP W/TINT 26 (MISCELLANEOUS) ×2 IMPLANT
CLIP APPLIE 5 13 M/L LIGAMAX5 (MISCELLANEOUS) IMPLANT
CLIP APPLIE ROT 10 11.4 M/L (STAPLE) IMPLANT
CLIP VESOLOCK MED LG 6/CT (CLIP) IMPLANT
COVER MAYO STAND STRL (DRAPES) ×1 IMPLANT
COVER SURGICAL LIGHT HANDLE (MISCELLANEOUS) ×2 IMPLANT
COVER WAND RF STERILE (DRAPES) ×1 IMPLANT
DECANTER SPIKE VIAL GLASS SM (MISCELLANEOUS) ×2 IMPLANT
DERMABOND ADVANCED (GAUZE/BANDAGES/DRESSINGS)
DERMABOND ADVANCED .7 DNX12 (GAUZE/BANDAGES/DRESSINGS) IMPLANT
DRAPE C-ARM 42X120 X-RAY (DRAPES) ×1 IMPLANT
DRSG TEGADERM 2-3/8X2-3/4 SM (GAUZE/BANDAGES/DRESSINGS) ×1 IMPLANT
ELECT PENCIL ROCKER SW 15FT (MISCELLANEOUS) ×1 IMPLANT
ELECT REM PT RETURN 15FT ADLT (MISCELLANEOUS) ×2 IMPLANT
GAUZE SPONGE 2X2 8PLY STRL LF (GAUZE/BANDAGES/DRESSINGS) IMPLANT
GLOVE BIO SURGEON STRL SZ7.5 (GLOVE) ×2 IMPLANT
GLOVE INDICATOR 8.0 STRL GRN (GLOVE) ×2 IMPLANT
GOWN STRL REUS W/TWL XL LVL3 (GOWN DISPOSABLE) ×6 IMPLANT
GRASPER SUT TROCAR 14GX15 (MISCELLANEOUS) ×1 IMPLANT
HEMOSTAT ARISTA ABSORB 3G PWDR (HEMOSTASIS) IMPLANT
HEMOSTAT SNOW SURGICEL 2X4 (HEMOSTASIS) IMPLANT
KIT BASIN OR (CUSTOM PROCEDURE TRAY) ×2 IMPLANT
KIT TURNOVER KIT A (KITS) ×1 IMPLANT
L-HOOK LAP DISP 36CM (ELECTROSURGICAL)
LHOOK LAP DISP 36CM (ELECTROSURGICAL) IMPLANT
POUCH RETRIEVAL ECOSAC 10 (ENDOMECHANICALS) ×1 IMPLANT
POUCH RETRIEVAL ECOSAC 10MM (ENDOMECHANICALS) ×2
SCISSORS LAP 5X35 DISP (ENDOMECHANICALS) ×2 IMPLANT
SET CHOLANGIOGRAPH MIX (MISCELLANEOUS) ×1 IMPLANT
SET IRRIG TUBING LAPAROSCOPIC (IRRIGATION / IRRIGATOR) ×2 IMPLANT
SET TUBE SMOKE EVAC HIGH FLOW (TUBING) ×2 IMPLANT
SLEEVE XCEL OPT CAN 5 100 (ENDOMECHANICALS) ×4 IMPLANT
SPONGE GAUZE 2X2 STER 10/PKG (GAUZE/BANDAGES/DRESSINGS)
STRIP CLOSURE SKIN 1/2X4 (GAUZE/BANDAGES/DRESSINGS) IMPLANT
SUT MNCRL AB 4-0 PS2 18 (SUTURE) ×2 IMPLANT
SUT VIC AB 0 UR5 27 (SUTURE) IMPLANT
SUT VICRYL 0 TIES 12 18 (SUTURE) ×1 IMPLANT
SUT VICRYL 0 UR6 27IN ABS (SUTURE) ×1 IMPLANT
TOWEL OR 17X26 10 PK STRL BLUE (TOWEL DISPOSABLE) ×2 IMPLANT
TOWEL OR NON WOVEN STRL DISP B (DISPOSABLE) ×2 IMPLANT
TRAY LAPAROSCOPIC (CUSTOM PROCEDURE TRAY) ×2 IMPLANT
TROCAR BLADELESS OPT 5 100 (ENDOMECHANICALS) ×2 IMPLANT
TROCAR XCEL BLUNT TIP 100MML (ENDOMECHANICALS) IMPLANT
TROCAR XCEL NON-BLD 11X100MML (ENDOMECHANICALS) ×1 IMPLANT

## 2020-05-16 NOTE — Anesthesia Postprocedure Evaluation (Signed)
Anesthesia Post Note  Patient: [name]  Procedure(s) Performed: LAPAROSCOPIC CHOLECYSTECTOMY WITH INTRAOPERATIVE CHOLANGIOGRAM AND UMBILICAL HERNIA REPAIR (N/A Abdomen)     Patient location during evaluation: PACU Anesthesia Type: General Level of consciousness: awake and alert and oriented Pain management: pain level controlled Vital Signs Assessment: post-procedure vital signs reviewed and stable Respiratory status: spontaneous breathing, nonlabored ventilation and respiratory function stable Cardiovascular status: blood pressure returned to baseline and tachycardic (Patient remains in intermittent Aflutter with rates 120s. Asymptomatic with stable BP. Will take his PO metoprolol as scheduled.) Postop Assessment: no apparent nausea or vomiting Anesthetic complications: no   No complications documented.  Last Vitals:  Vitals:   05/16/20 1300 05/16/20 1337  BP: 125/90 116/86  Pulse: (!) 126 (!) 132  Resp: 16 18  Temp:  36.7 C  SpO2: 96% 100%    Last Pain:  Vitals:   05/16/20 1337  TempSrc: Oral  PainSc: 3                  Kaylyn Layer

## 2020-05-16 NOTE — H&P (Signed)
CC: here for surgery  Requesting provider:   HPI: [name] is an 55 y.o. male who is here for laparoscopic cholecystectomy with possible cholangiogram.  He was originally scheduled about 1 month ago but his preoperative Covid test came back positive.  Therefore he was canceled.  He had mild symptoms.  He did not require hospitalization.  He did not require treatment.  He reports that he has had a few additional gallbladder attacks since I last saw him in the clinic.  He did receive cardiac clearance.  He is no longer on Xarelto.  Otherwise he denies any changes since I saw him in clinic  History: The patient is a 55 year old male who presents for evaluation of gall stones. he is referred by Dr Criss Alvine to discuss gallstones. He states that he has paroxysmal atrial fibrillation and on a Wednesday in September he went for a preprocedure CT scan. That evening he developed pain in his right upper back which radiated to his right upper quadrant and epigastrium. He felt bloated. He felt pressure. It felt like a spasm. It was fairly intense. After 3 hours it did not get better so he went to the emergency room. He had tried walking around and sitting in a hot tub without relief. In the emergency room he was found to have some blood in his urine. He had a mildly elevated creatinine. His white count was slightly elevated at 10.6. Otherwise his labs were negative. He had a CT scan which demonstrated 2-3 mm nonobstructing right kidney stones. He also underwent an abdominal ultrasound which showed multiple gallstones without any evidence of cholecystitis. It was felt that his symptoms are more gallbladder related. He reports a similar episode last Christmas Eve where he had pain in his right upper back right side and right upper quadrant that was fairly intense but he stopped after an hour or 2. He had bloating with it. Since being in the ER in mid-September he reports 3 additional episodes  of the same type of pain October 3, October 4 of October 17. They were not as intense as the trips that made him go to the emergency room. He did take a pain pill that he was prescribed for the episodes on the third and fourth. He is had a prior kidney stone the left and states that this pain was different. He underwent cardiac ablation in mid-September. He is on xarelto. He states his cardiologist told him he would need to stay on the blood thinner for 3 months. He has sleep apnea and uses CPAP. No prior abdominal surgery. No TIAs or amaurosis fugax. No chest pain, chest pressure, source of breath, dyspnea on exertion  Past Medical History:  Diagnosis Date  . Dysrhythmia    hx a fib with ablation  . Gallstones   . History of kidney stones   . Persistent atrial fibrillation (HCC)   . Sleep apnea    uses CPAP    Past Surgical History:  Procedure Laterality Date  . ATRIAL FIBRILLATION ABLATION N/A 01/02/2020   Procedure: ATRIAL FIBRILLATION ABLATION;  Surgeon: Hillis Range, MD;  Location: [facility];  Service: Cardiovascular;  Laterality: N/A;  . CARDIOVERSION N/A 08/08/2019   Procedure: CARDIOVERSION;  Surgeon: Chilton Si, MD;  Location: [facility] ENDOSCOPY;  Service: Cardiovascular;  Laterality: N/A;  . CYST EXCISION     removed back of leg  . EXTRACORPOREAL SHOCK WAVE LITHOTRIPSY Left 06/08/2019   Procedure: EXTRACORPOREAL SHOCK WAVE LITHOTRIPSY (ESWL);  Surgeon: Noel Christmas, MD;  Location: [facility];  Service: Urology;  Laterality: Left;    Family History  Problem Relation Age of Onset  . Hypertension Mother     Social:  reports that he has never smoked. He has never used smokeless tobacco. He reports previous alcohol use of about 2.0 standard drinks of alcohol per week. He reports that he does not use drugs.  Allergies: No Known Allergies  Medications: I have reviewed the patient's current medications.   ROS - all of the below systems  have been reviewed with the patient and positives are indicated with bold text General: chills, fever or night sweats Eyes: blurry vision or double vision ENT: epistaxis or sore throat Allergy/Immunology: itchy/watery eyes or nasal congestion Hematologic/Lymphatic: bleeding problems, blood clots or swollen lymph nodes Endocrine: temperature intolerance or unexpected weight changes Breast: new or changing breast lumps or nipple discharge Resp: cough, shortness of breath, or wheezing CV: chest pain or dyspnea on exertion GI: as per HPI GU: dysuria, trouble voiding, or hematuria MSK: joint pain or joint stiffness Neuro: TIA or stroke symptoms Derm: pruritus and skin lesion changes Psych: anxiety and depression  PE There were no vitals taken for this visit. Constitutional: NAD; conversant; no deformities Eyes: Moist conjunctiva; no lid lag; anicteric; PERRL Neck: Trachea midline; no thyromegaly Lungs: Normal respiratory effort; no tactile fremitus CV: RRR; no palpable thrills; no pitting edema GI: Abd soft, nt, nd; no palpable hepatosplenomegaly MSK: Normal gait; no clubbing/cyanosis Psychiatric: Appropriate affect; alert and oriented x3 Lymphatic: No palpable cervical or axillary lymphadenopathy Skin:no rash/edema  No results found for this or any previous visit (from the past 48 hour(s)).  No results found.  Imaging: reviewed  A/P: [name] is an 55 y.o. male with  Symptomatic cholelithiasis Paroxysmal atrial fibrillation Obstructive sleep apnea  2 OR for laparoscopic cholecystectomy with possible cholangiogram IV antibiotic Enhanced recovery protocol Rediscussed steps of surgery Discussed surgical resident involvement with the case All questions asked and answered  Mary Sella. [name], MD, FACS General, Bariatric, & Minimally Invasive Surgery [facility], [place]

## 2020-05-16 NOTE — Discharge Instructions (Signed)
[facility] °LAPAROSCOPIC SURGERY: POST OP INSTRUCTIONS °Always review your discharge instruction sheet given to you by the facility where your surgery was performed. °IF YOU HAVE DISABILITY OR FAMILY LEAVE FORMS, YOU MUST BRING THEM TO THE OFFICE FOR PROCESSING.   °DO NOT GIVE THEM TO YOUR DOCTOR. ° °PAIN CONTROL ° °1. First take acetaminophen (Tylenol) AND/or ibuprofen (Advil) to control your pain after surgery.  Follow directions on package.  Taking acetaminophen (Tylenol) and/or ibuprofen (Advil) regularly after surgery will help to control your pain and lower the amount of prescription pain medication you may need.  You should not take more than 3,000 mg (3 grams) of acetaminophen (Tylenol) in 24 hours.  You should not take ibuprofen (Advil), aleve, motrin, naprosyn or other NSAIDS if you have a history of stomach ulcers or chronic kidney disease.  °2. A prescription for pain medication may be given to you upon discharge.  Take your pain medication as prescribed, if you still have uncontrolled pain after taking acetaminophen (Tylenol) or ibuprofen (Advil). °3. Use ice packs to help control pain. °4. If you need a refill on your pain medication, please contact your pharmacy.  They will contact our office to request authorization. Prescriptions will not be filled after 5pm or on week-ends. ° °HOME MEDICATIONS °5. Take your usually prescribed medications unless otherwise directed. ° °DIET °6. You should follow a light diet the first few days after arrival home.  Be sure to include lots of fluids daily. Avoid fatty, fried foods.  ° °CONSTIPATION °7. It is common to experience some constipation after surgery and if you are taking pain medication.  Increasing fluid intake and taking a stool softener (such as Colace) will usually help or prevent this problem from occurring.  A mild laxative (Milk of Magnesia or Miralax) should be taken according to package instructions if there are no bowel  movements after 48 hours. ° °WOUND/INCISION CARE °8. Most patients will experience some swelling and bruising in the area of the incisions.  Ice packs will help.  Swelling and bruising can take several days to resolve.  °9. Unless discharge instructions indicate otherwise, follow guidelines below  °a. STERI-STRIPS - you may remove your outer bandages 48 hours after surgery, and you may shower at that time.  You have steri-strips (small skin tapes) in place directly over the incision.  These strips should be left on the skin for 7-10 days.   °b. DERMABOND/SKIN GLUE - you may shower in 24 hours.  The glue will flake off over the next 2-3 weeks. °10. Any sutures or staples will be removed at the office during your follow-up visit. ° °ACTIVITIES °11. You may resume regular (light) daily activities beginning the next day--such as daily self-care, walking, climbing stairs--gradually increasing activities as tolerated.  You may have sexual intercourse when it is comfortable.  Refrain from any heavy lifting or straining until approved by your doctor. °a. You may drive when you are no longer taking prescription pain medication, you can comfortably wear a seatbelt, and you can safely maneuver your car and apply brakes. ° °FOLLOW-UP °12. You should see your doctor in the office for a follow-up appointment approximately 2-3 weeks after your surgery.  You should have been given your post-op/follow-up appointment when your surgery was scheduled.  If you did not receive a post-op/follow-up appointment, make sure that you call for this appointment within a day or two after you arrive home to insure a convenient appointment time. ° °OTHER   INSTRUCTIONS 13. Do not lift/push/pull anything greater than 10 lbs  WHEN TO CALL YOUR DOCTOR: 1. Fever over 101.0 2. Inability to urinate 3. Continued bleeding from incision. 4. Increased pain, redness, or drainage from the incision. 5. Increasing abdominal pain  The clinic staff is  available to answer your questions during regular business hours.  Please dont hesitate to call and ask to speak to one of the nurses for clinical concerns.  If you have a medical emergency, go to the nearest emergency room or call 911.  A surgeon from [facility] is always on call at the hospital. [address], [place], [place]  [address] ? [address] [address], [place], [place]   [address] [phone] ? [phone] ? FAX [phone] Web site: [email or web address]

## 2020-05-16 NOTE — Anesthesia Procedure Notes (Signed)
Procedure Name: Intubation Date/Time: 05/16/2020 9:34 AM Performed by: Kaylyn Layer, MD Pre-anesthesia Checklist: Patient identified, Emergency Drugs available, Suction available and Patient being monitored Patient Re-evaluated:Patient Re-evaluated prior to induction Oxygen Delivery Method: Circle System Utilized Preoxygenation: Pre-oxygenation with 100% oxygen Induction Type: IV induction Ventilation: Mask ventilation without difficulty and Oral airway inserted - appropriate to patient size Laryngoscope Size: Hyacinth Meeker and 2 Grade View: Grade III Tube type: Oral Tube size: 7.5 mm Number of attempts: 2 Airway Equipment and Method: Stylet and Oral airway Placement Confirmation: ETT inserted through vocal cords under direct vision,  positive ETCO2 and breath sounds checked- equal and bilateral Secured at: 21 cm Tube secured with: Tape Dental Injury: Teeth and Oropharynx as per pre-operative assessment  Difficulty Due To: Difficult Airway- due to anterior larynx and Difficulty was anticipated Future Recommendations: Recommend- induction with short-acting agent, and alternative techniques readily available Comments: First attempt by myself with Miller 2 blade, grade 3 view, anterior larynx noted. Second attempt with Glidescope 3 blade. Atraumatic intubation. Mask ventilated between attempts, SpO2>98% throughout induction. Stephannie Peters, MD

## 2020-05-16 NOTE — Op Note (Signed)
COBI DELPH [id number] 1965-08-05 05/16/2020  Laparoscopic Cholecystectomy with IOC with umbilical hernia repair Procedure Note  Indications: This patient presents with symptomatic gallbladder disease and will undergo laparoscopic cholecystectomy.  Pre-operative Diagnosis: symptomatic cholelithiasis   Post-operative Diagnosis: Same  Surgeon: Gaynelle Adu MD FACS  Assistants: Dr Darrol Angel, Surgical resident  Anesthesia: General endotracheal anesthesia  Procedure Details  The patient was seen again in the Holding Room. The risks, benefits, complications, treatment options, and expected outcomes were discussed with the patient. The possibilities of reaction to medication, pulmonary aspiration, perforation of viscus, bleeding, recurrent infection, finding a normal gallbladder, the need for additional procedures, failure to diagnose a condition, the possible need to convert to an open procedure, and creating a complication requiring transfusion or operation were discussed with the patient. The likelihood of improving the patient's symptoms with return to their baseline status is good.  The patient and/or family concurred with the proposed plan, giving informed consent. The site of surgery properly noted. The patient was taken to Operating Room, identified as DAQUON GREENLEAF and the procedure verified as Laparoscopic Cholecystectomy with Intraoperative Cholangiogram. A Time Out was held and the above information confirmed. Antibiotic prophylaxis was administered.   Prior to the induction of general anesthesia, antibiotic prophylaxis was administered. General endotracheal anesthesia was then administered and tolerated well. After the induction, the abdomen was prepped with Chloraprep and draped in the sterile fashion. The patient was positioned in the supine position.  Patient had a small pre-existing umbilical fascial defect so I decided to use that defect as injury.  Local anesthetic agent was  injected into the skin near the umbilicus and an curvilinear incision made. We dissected down to the abdominal fascia with blunt dissection.  The umbilical stalk was mobilized from the fascia.  This actually revealed probably about a 2 cm fascial defect.  A pursestring suture of 0-Vicryl was placed around the fascial opening.  The Hasson cannula was inserted and secured with the stay suture.  Pneumoperitoneum was then created with CO2 and tolerated well without any adverse changes in the patient's vital signs. An 5-mm port was placed in the subxiphoid position.  Two 5-mm ports were placed in the right upper quadrant. All skin incisions were infiltrated with a local anesthetic agent before making the incision and placing the trocars.   We positioned the patient in reverse Trendelenburg, tilted slightly to the patient's left.  The gallbladder was identified, the fundus grasped and retracted cephalad.  The resident lysed adhesions bluntly and with the electrocautery where indicated, taking care not to injure any adjacent organs or viscus. The infundibulum was grasped and retracted laterally, exposing the peritoneum overlying the triangle of Calot. This was then divided and exposed in a blunt fashion. A critical view of the cystic duct and cystic artery was obtained.  The cystic duct was clearly identified and bluntly dissected circumferentially. The cystic duct was ligated with a clip distally.   An incision was made in the cystic duct and the [facility] cholangiogram catheter introduced. The catheter was secured using a clip. A cholangiogram was then obtained which showed good visualization of the distal and proximal biliary tree with no sign of filling defects or obstruction.  On the initial cholangiogram there was a little bit of spasming at the sphincter however a subsequent x-ray revealed filling of the small bowel.  contrast flowed easily into the duodenum. The catheter was then removed.   The cystic duct was then  ligated with clips and divided. The  cystic artery which had been identified & dissected free was ligated with clips and divided as well.  As we were mobilizing the gallbladder off of the hepatic triangle, there was some bleeding.  There was a posterior cystic artery branch.  The subxiphoid trocar was enlarged for an 11 mm trocar.  Two 10 mm clips were placed and there was hemostasis.  The gallbladder was dissected from the liver bed in retrograde fashion with the electrocautery.  We then reinspected the area where there had been bleeding from the exterior cystic artery branch.  There was no additional bleeding.  The gallbladder was removed and placed in an Ecco sac.  The gallbladder and Ecco sac were then removed through the umbilical port site. The liver bed was irrigated and inspected. Hemostasis was achieved with the electrocautery by the resident. Copious irrigation was utilized and was repeatedly aspirated until clear.  Again we inspected the clips along the cystic duct and cystic artery both anterior and posterior branches.  There is no bleeding.  A piece of Ethicon surgical snow was placed in this area.  The pursestring suture was used to close the umbilical fascia.  3 additional interrupted 0 Vicryls were placed at the umbilical fascia using a PMI suture passer with laparoscopic assistance by the resident.  We again inspected the right upper quadrant for hemostasis.  The umbilical closure was inspected and there was no air leak and nothing trapped within the closure. Pneumoperitoneum was released as we removed the trocars.  4-0 Monocryl was used to close the skin.    steri-strips, and clean dressings were applied. The patient was then extubated and brought to the recovery room in stable condition. Instrument, sponge, and needle counts were correct at closure and at the conclusion of the case.   Findings: Probable chronic Cholecystitis with Cholelithiasis +critical view nml IOC Some bleeding from  posterior cystic artery branch which was controlled with two 10 mm clips  Estimated Blood Loss: 50 ml         Drains: none         Specimens: Gallbladder           Complications: None; patient tolerated the procedure well.         Disposition: PACU - hemodynamically stable.  I was personally present & scrubbed & (performed certain portions of the procedure) during the entire procedure as documented in my operative note.  Condition: stable  Mary Sella. [name], MD, FACS General, Bariatric, & Minimally Invasive Surgery [facility], [place]

## 2020-05-16 NOTE — Transfer of Care (Signed)
Immediate Anesthesia Transfer of Care Note  Patient: [name]  Procedure(s) Performed: LAPAROSCOPIC CHOLECYSTECTOMY WITH INTRAOPERATIVE CHOLANGIOGRAM AND UMBILICAL HERNIA REPAIR (N/A Abdomen)  Patient Location: PACU  Anesthesia Type:General  Level of Consciousness: drowsy and patient cooperative  Airway & Oxygen Therapy: Patient Spontanous Breathing and Patient connected to face mask oxygen  Post-op Assessment: Report given to RN and Post -op Vital signs reviewed and stable  Post vital signs: Reviewed and stable  Last Vitals:  Vitals Value Taken Time  BP 98/66 05/16/20 1151  Temp    Pulse 63 05/16/20 1154  Resp 16 05/16/20 1154  SpO2 100 % 05/16/20 1154  Vitals shown include unvalidated device data.  Last Pain:  Vitals:   05/16/20 0756  TempSrc: Oral  PainSc: 0-No pain         Complications: No complications documented.

## 2020-05-16 NOTE — Progress Notes (Signed)
Patients HR noticed to be in afib with hr from 104-140; Pt denies any Chest pain or SOB ; No s/s of distress noted ; Kim oncoming nurse aware  and  Anesthesia will be notified ;

## 2020-05-17 ENCOUNTER — Encounter (HOSPITAL_COMMUNITY): Payer: Self-pay | Admitting: General Surgery

## 2020-05-17 LAB — SURGICAL PATHOLOGY

## 2020-07-01 ENCOUNTER — Encounter: Payer: Self-pay | Admitting: Internal Medicine

## 2020-07-01 ENCOUNTER — Other Ambulatory Visit: Payer: Self-pay

## 2020-07-01 ENCOUNTER — Ambulatory Visit (INDEPENDENT_AMBULATORY_CARE_PROVIDER_SITE_OTHER): Payer: 59 | Admitting: Internal Medicine

## 2020-07-01 VITALS — BP 136/84 | HR 59 | Ht 74.0 in | Wt 202.4 lb

## 2020-07-01 DIAGNOSIS — I4819 Other persistent atrial fibrillation: Secondary | ICD-10-CM | POA: Diagnosis not present

## 2020-07-01 DIAGNOSIS — I484 Atypical atrial flutter: Secondary | ICD-10-CM | POA: Diagnosis not present

## 2020-07-01 DIAGNOSIS — G4733 Obstructive sleep apnea (adult) (pediatric): Secondary | ICD-10-CM | POA: Diagnosis not present

## 2020-07-01 NOTE — Progress Notes (Signed)
PCP: Joycelyn Rua, MD   Primary EP: Dr Johney Frame  [name] is a 55 y.o. male who presents today for routine electrophysiology followup.  Since last being seen in our clinic, the patient reports doing very well.  He had his gall bladder out in February.  He has had some afib post operatively but feels that heart rates are mostly controlled.   Today, he denies symptoms of palpitations, chest pain, shortness of breath,  lower extremity edema, dizziness, presyncope, or syncope.  The patient is otherwise without complaint today.   Past Medical History:  Diagnosis Date  . Dysrhythmia    hx a fib with ablation  . Gallstones   . History of kidney stones   . Persistent atrial fibrillation (HCC)   . Sleep apnea    uses CPAP   Past Surgical History:  Procedure Laterality Date  . ATRIAL FIBRILLATION ABLATION N/A 01/02/2020   Procedure: ATRIAL FIBRILLATION ABLATION;  Surgeon: Hillis Range, MD;  Location: [facility] INVASIVE CV LAB;  Service: Cardiovascular;  Laterality: N/A;  . CARDIOVERSION N/A 08/08/2019   Procedure: CARDIOVERSION;  Surgeon: Chilton Si, MD;  Location[facility] ENDOSCOPY;  Service: Cardiovascular;  Laterality: N/A;  . CHOLECYSTECTOMY N/A 05/16/2020   Procedure: LAPAROSCOPIC CHOLECYSTECTOMY WITH INTRAOPERATIVE CHOLANGIOGRAM AND UMBILICAL HERNIA REPAIR;  Surgeon: Gaynelle Adu, MD;  Location: [facility] ORS;  Service: General;  Laterality: N/A;  . CYST EXCISION     removed back of leg  . EXTRACORPOREAL SHOCK WAVE LITHOTRIPSY Left 06/08/2019   Procedure: EXTRACORPOREAL SHOCK WAVE LITHOTRIPSY (ESWL);  Surgeon: Noel Christmas, MD;  Location: [facility];  Service: Urology;  Laterality: Left;    ROS- all systems are reviewed and negatives except as per HPI above  Current Outpatient Medications  Medication Sig Dispense Refill  . metoprolol succinate (TOPROL-XL) 25 MG 24 hr tablet Take 0.5 tablets (12.5 mg total) by mouth daily. 15 tablet 3  . prednisoLONE acetate (PRED  FORTE) 1 % ophthalmic suspension Place 1 drop into the left eye daily as needed (redness and swelling).     . zinc gluconate 50 MG tablet Take 50 mg by mouth 2 (two) times a week.     No current facility-administered medications for this visit.    Physical Exam: Vitals:   07/01/20 1056  BP: 136/84  Pulse: (!) 59  SpO2: 99%  Weight: 202 lb 6.4 oz (91.8 kg)  Height: 6\' 2"  (1.88 m)    GEN- The patient is well appearing, alert and oriented x 3 today.   Head- normocephalic, atraumatic Eyes-  Sclera clear, conjunctiva pink Ears- hearing intact Oropharynx- clear Lungs- Clear to ausculation bilaterally, normal work of breathing Heart- Regular rate and rhythm, no murmurs, rubs or gallops, PMI not laterally displaced GI- soft, NT, ND, + BS Extremities- no clubbing, cyanosis, or edema  Wt Readings from Last 3 Encounters:  07/01/20 202 lb 6.4 oz (91.8 kg)  05/16/20 192 lb (87.1 kg)  05/06/20 192 lb (87.1 kg)    EKG tracing ordered today is personally reviewed and shows inus  Assessment and Plan:  1. Paroxysmal atrial fibrillation/ atrial flutter Reasonably well controlled post ablation off AAD therapy.  He has had some recent AF by his apple watch.  He will use flecainide as a pill in pocket approach.  If AF increases, he may want to consider repeat ablation. chads2vasc score is 0.  He does not require OAC therapy  2. OSA Follows with Dr 05/08/20  Risks, benefits and potential toxicities for  medications prescribed and/or refilled reviewed with patient today.   Return in 3 months  Hillis Range MD, [facility] 07/01/2020 11:18 AM

## 2020-07-01 NOTE — Patient Instructions (Addendum)
Medication Instructions:  Your physician recommends that you continue on your current medications as directed. Please refer to the Current Medication list given to you today.  Labwork: None ordered.  Testing/Procedures: None ordered.  Follow-Up: Your physician wants you to follow-up in: 09/30/20 at 3:30 pm with [name].   Any Other Special Instructions Will Be Listed Below (If Applicable).  If you need a refill on your cardiac medications before your next appointment, please call your pharmacy.

## 2020-07-03 ENCOUNTER — Other Ambulatory Visit (HOSPITAL_COMMUNITY): Payer: Self-pay | Admitting: Physician Assistant

## 2020-09-30 ENCOUNTER — Ambulatory Visit (INDEPENDENT_AMBULATORY_CARE_PROVIDER_SITE_OTHER): Payer: 59

## 2020-09-30 ENCOUNTER — Encounter: Payer: Self-pay | Admitting: Internal Medicine

## 2020-09-30 ENCOUNTER — Ambulatory Visit (INDEPENDENT_AMBULATORY_CARE_PROVIDER_SITE_OTHER): Payer: 59 | Admitting: Internal Medicine

## 2020-09-30 ENCOUNTER — Other Ambulatory Visit: Payer: Self-pay

## 2020-09-30 VITALS — BP 134/80 | HR 66 | Ht 74.0 in | Wt 215.4 lb

## 2020-09-30 DIAGNOSIS — I480 Paroxysmal atrial fibrillation: Secondary | ICD-10-CM

## 2020-09-30 DIAGNOSIS — I484 Atypical atrial flutter: Secondary | ICD-10-CM | POA: Diagnosis not present

## 2020-09-30 DIAGNOSIS — G4733 Obstructive sleep apnea (adult) (pediatric): Secondary | ICD-10-CM | POA: Diagnosis not present

## 2020-09-30 NOTE — Progress Notes (Unsigned)
Patient enrolled for [employer] to mail a 14 day ZIO XT to address on file.  

## 2020-09-30 NOTE — Progress Notes (Signed)
PCP: Joycelyn Rua, MD   Primary EP: Dr Johney Frame  [name] is a 55 y.o. male who presents today for routine electrophysiology followup.  Since last being seen in our clinic, the patient reports doing reasonably well.  He continues to have palpitations.  He has recorded AF on his apple watch in May but has not recently tried to gets ecgs when he feels palpitations.  Today, he denies symptoms of chest pain, shortness of breath,  lower extremity edema, dizziness, presyncope, or syncope.  The patient is otherwise without complaint today.   Past Medical History:  Diagnosis Date   Dysrhythmia    hx a fib with ablation   Gallstones    History of kidney stones    Persistent atrial fibrillation (HCC)    Sleep apnea    uses CPAP   Past Surgical History:  Procedure Laterality Date   ATRIAL FIBRILLATION ABLATION N/A 01/02/2020   Procedure: ATRIAL FIBRILLATION ABLATION;  Surgeon: Hillis Range, MD;  Location: [facility]ASIVE [facility];  Service: Cardiovascular;  Laterality: N/A;   CARDIOVERSION N/A 08/08/2019   Procedure: CARDIOVERSION;  Surgeon: Chilton Si, MD;  Location: [facility] ENDOSCOPY;  Service: Cardiovascular;  Laterality: N/A;   CHOLECYSTECTOMY N/A 05/16/2020   Procedure: LAPAROSCOPIC CHOLECYSTECTOMY WITH INTRAOPERATIVE CHOLANGIOGRAM AND UMBILICAL HERNIA REPAIR;  Surgeon: Gaynelle Adu, MD;  Location: [facility] ORS;  Service: General;  Laterality: N/A;   CYST EXCISION     removed back of leg   EXTRACORPOREAL SHOCK WAVE LITHOTRIPSY Left 06/08/2019   Procedure: EXTRACORPOREAL SHOCK WAVE LITHOTRIPSY (ESWL);  Surgeon: Noel Christmas, MD;  Location: [facility];  Service: Urology;  Laterality: Left;    ROS- all systems are reviewed and negatives except as per HPI above  Current Outpatient Medications  Medication Sig Dispense Refill   metoprolol succinate (TOPROL-XL) 25 MG 24 hr tablet TAKE 1/2 TABLET BY MOUTH EVERY DAY 15 tablet 3   prednisoLONE acetate (PRED FORTE) 1 % ophthalmic  suspension Place 1 drop into the left eye daily as needed (redness and swelling).      zinc gluconate 50 MG tablet Take 50 mg by mouth 2 (two) times a week.     No current facility-administered medications for this visit.    Physical Exam: Vitals:   09/30/20 1546  BP: 134/80  Pulse: 66  SpO2: 98%  Weight: 215 lb 6.4 oz (97.7 kg)  Height: 6\' 2"  (1.88 m)     GEN- The patient is well appearing, alert and oriented x 3 today.   Head- normocephalic, atraumatic Eyes-  Sclera clear, conjunctiva pink Ears- hearing intact Oropharynx- clear Lungs- Clear to ausculation bilaterally, normal work of breathing Heart- Regular rate and rhythm, no murmurs, rubs or gallops, PMI not laterally displaced GI- soft, NT, ND, + BS Extremities- no clubbing, cyanosis, or edema  Wt Readings from Last 3 Encounters:  09/30/20 215 lb 6.4 oz (97.7 kg)  07/01/20 202 lb 6.4 oz (91.8 kg)  05/16/20 192 lb (87.1 kg)    EKG tracing ordered today is personally reviewed and show  Return in 6 months  07/14/20 MD, [facility] 09/30/2020 3:48 PM s sinus  Assessment and Plan:  Paroxysmal atrial fibrillatin/ atrial flutter Doing reasonably well  post ablation off AAD therapy He has palpitations of unclear etiology We will place 14 day Zio. Chads2vasc score is 0.  Does not require OAC currently. If he has further afib, options would be to restart flecainide or consider repeat ablation  2. OSA Follows with  Dr Earl Gala  Return to AF clinic in 8 weeks to discuss result of Zio monitor  Hillis Range MD, [facility] 09/30/2020 4:01 PM

## 2020-09-30 NOTE — Patient Instructions (Addendum)
Medication Instructions:  Your physician recommends that you continue on your current medications as directed. Please refer to the Current Medication list given to you today.  Labwork: None ordered.  Testing/Procedures: Your physician has recommended that you wear a heart monitor. Heart monitors are medical devices that record the heart's electrical activity. Doctors most often use these monitors to diagnose arrhythmias. Arrhythmias are problems with the speed or rhythm of the heartbeat. The monitor is a small, portable device. You can wear one while you do your normal daily activities. This is usually used to diagnose what is causing palpitations/syncope (passing out).   Follow-Up: Your physician wants you to follow-up in: 8 weeks in the AF Clinic. They will contact you to schedule.    Any Other Special Instructions Will Be Listed Below (If Applicable).  If you need a refill on your cardiac medications before your next appointment, please call your pharmacy.   ZIO XT- Long Term Monitor Instructions  Your physician has requested you wear a ZIO patch monitor for 14 days.  This is a single patch monitor. [employer] supplies one patch monitor per enrollment. Additional stickers are not available. Please do not apply patch if you will be having a Nuclear Stress Test,  Echocardiogram, Cardiac CT, MRI, or Chest Xray during the period you would be wearing the  monitor. The patch cannot be worn during these tests. You cannot remove and re-apply the  ZIO XT patch monitor.  Your ZIO patch monitor will be mailed 3 day USPS to your address on file. It may take 3-5 days  to receive your monitor after you have been enrolled.  Once you have received your monitor, please review the enclosed instructions. Your monitor  has already been registered assigning a specific monitor serial # to you.  Billing and Patient Assistance Program Information  We have supplied [employer] with any of your insurance  information on file for billing purposes. [employer] offers a sliding scale Patient Assistance Program for patients that do not have  insurance, or whose insurance does not completely cover the cost of the ZIO monitor.  You must apply for the Patient Assistance Program to qualify for this discounted rate.  To apply, please call [employer] at [phone], select option 4, select option 2, ask to apply for  Patient Assistance Program. Meredeth Ide will ask your household income, and how many people  are in your household. They will quote your out-of-pocket cost based on that information.  [employer] will also be able to set up a 60-month, interest-free payment plan if needed.  Applying the monitor   Shave hair from upper left chest.  Hold abrader disc by orange tab. Rub abrader in 40 strokes over the upper left chest as  indicated in your monitor instructions.  Clean area with 4 enclosed alcohol pads. Let dry.  Apply patch as indicated in monitor instructions. Patch will be placed under collarbone on left  side of chest with arrow pointing upward.  Rub patch adhesive wings for 2 minutes. Remove white label marked "1". Remove the white  label marked "2". Rub patch adhesive wings for 2 additional minutes.  While looking in a mirror, press and release button in center of patch. A small green light will  flash 3-4 times. This will be your only indicator that the monitor has been turned on.  Do not shower for the first 24 hours. You may shower after the first 24 hours.  Press the button if you feel a symptom. You will hear  a small click. Record Date, Time and  Symptom in the Patient Logbook.  When you are ready to remove the patch, follow instructions on the last 2 pages of Patient  Logbook. Stick patch monitor onto the last page of Patient Logbook.  Place Patient Logbook in the blue and white box. Use locking tab on box and tape box closed  securely. The blue and white box has prepaid postage on it. Please  place it in the mailbox as  soon as possible. Your physician should have your test results approximately 7 days after the  monitor has been mailed back to [facility].  Call [facility] Customer Care at [phone] if you have questions regarding  your ZIO XT patch monitor. Call them immediately if you see an orange light blinking on your  monitor.  If your monitor falls off in less than 4 days, contact our Monitor department at [phone].  If your monitor becomes loose or falls off after 4 days call [employer] at [phone] for  suggestions on securing your monitor

## 2020-10-21 ENCOUNTER — Other Ambulatory Visit (HOSPITAL_COMMUNITY): Payer: Self-pay | Admitting: Internal Medicine

## 2020-11-18 ENCOUNTER — Telehealth: Payer: Self-pay | Admitting: Internal Medicine

## 2020-11-18 DIAGNOSIS — I480 Paroxysmal atrial fibrillation: Secondary | ICD-10-CM | POA: Diagnosis not present

## 2020-11-18 DIAGNOSIS — G4733 Obstructive sleep apnea (adult) (pediatric): Secondary | ICD-10-CM

## 2020-11-18 DIAGNOSIS — I484 Atypical atrial flutter: Secondary | ICD-10-CM

## 2020-11-18 NOTE — Telephone Encounter (Signed)
Pt c/o medication issue:  1. Name of Medication: flecainide   2. How are you currently taking this medication (dosage and times per day)? As needed for afib  3. Are you having a reaction (difficulty breathing--STAT)? no  4. What is your medication issue? Patient states he is currently wearing a 2 week heart monitor and is assuming he needs to continue his medications as normal, but would like clarification on his flecainide. He states he was told he can take some flecainide if he is having an afib episode. He would like to know if he does have an episode or high HR does he need to avoid taking flecainide while the monitor is on.

## 2020-11-18 NOTE — Telephone Encounter (Signed)
Spoke to the patient and advised per MD Allred to continue everything that he has been doing prior to the monitor wear, during the wear. IF he takes Flecainide to add it in the log of the monitor. Verbalized understanding.

## 2020-12-09 ENCOUNTER — Ambulatory Visit (HOSPITAL_COMMUNITY): Payer: 59 | Admitting: Physician Assistant

## 2020-12-11 NOTE — Progress Notes (Signed)
Primary Care Physician: Joycelyn Rua, MD Primary Cardiologist: none Primary Electrophysiologist: Dr Johney Frame Referring Physician: Dr Boris Sharper is a 55 y.o. male with a history of nephrolithiasis, OSA, and new onset atrial fibrillation who presents for follow up in the [facility].  The patient was initially diagnosed with atrial fibrillation 06/08/19 after presenting for a ESWL for kidney stones. Afib with RVR noted incidentally and he was asymptomatic. Patient is not on anticoagulation with a CHADS2VASC score of 0. He was started on metoprolol and discharged in rate controlled afib. Patient reports that he may feel a "flutter" occasionally but he denies any other symptoms. He denies significant alcohol use. Patient is s/p unsuccessful DCCV on 08/08/19. Patient has started using his CPAP machine. Patient presented for DCCV on 08/30/19 and was found to be in SR after increasing flecainide. Unfortunately, he was back in rate controlled atrial flutter on follow up. Patient is s/p afib and flutter ablation with Dr Johney Frame on 01/02/20. He continued to have symptoms of palpitations and a 14 day Zio monitor was placed.   On follow up today, the cardiac monitor showed 49% atrial flutter burden. He continues to have frequent symptoms of palpitations. There are no specific triggers that he can identify. He reports compliance with his CPAP machine.   Today, he denies symptoms of chest pain, shortness of breath, orthopnea, PND, lower extremity edema, dizziness, presyncope, syncope, bleeding, or neurologic sequela. The patient is tolerating medications without difficulties and is otherwise without complaint today.    Atrial Fibrillation Risk Factors:  he does have symptoms or diagnosis of sleep apnea.  He is compliant with CPAP therapy. he does not have a history of rheumatic fever. he does not have a history of alcohol use. The patient does not have a history of  early familial atrial fibrillation or other arrhythmias.  he has a BMI of Body mass index is 27.42 kg/m.Marland Kitchen Filed Weights   12/12/20 0846  Weight: 96.9 kg     Family History  Problem Relation Age of Onset   Hypertension Mother      Atrial Fibrillation Management history:  Previous antiarrhythmic drugs: flecainide Previous cardioversions: 08/08/19 Previous ablations: 01/02/20 CHADS2VASC score: 0 Anticoagulation history: Xarelto   Past Medical History:  Diagnosis Date   Dysrhythmia    hx a fib with ablation   Gallstones    History of kidney stones    Persistent atrial fibrillation (HCC)    Sleep apnea    uses CPAP   Past Surgical History:  Procedure Laterality Date   ATRIAL FIBRILLATION ABLATION N/A 01/02/2020   Procedure: ATRIAL FIBRILLATION ABLATION;  Surgeon: Hillis Range, MD;  Location: [facility] INVASIVE CV LAB;  Service: Cardiovascular;  Laterality: N/A;   CARDIOVERSION N/A 08/08/2019   Procedure: CARDIOVERSION;  Surgeon: Chilton Si, MD;  Location: [facility] ENDOSCOPY;  Service: Cardiovascular;  Laterality: N/A;   CHOLECYSTECTOMY N/A 05/16/2020   Procedure: LAPAROSCOPIC CHOLECYSTECTOMY WITH INTRAOPERATIVE CHOLANGIOGRAM AND UMBILICAL HERNIA REPAIR;  Surgeon: Gaynelle Adu, MD;  Location: [facility] ORS;  Service: General;  Laterality: N/A;   CYST EXCISION     removed back of leg   EXTRACORPOREAL SHOCK WAVE LITHOTRIPSY Left 06/08/2019   Procedure: EXTRACORPOREAL SHOCK WAVE LITHOTRIPSY (ESWL);  Surgeon: Noel Christmas, MD;  Location: [facility];  Service: Urology;  Laterality: Left;    Current Outpatient Medications  Medication Sig Dispense Refill   flecainide (TAMBOCOR) 50 MG tablet Take 50 mg by mouth 2 (two) times  daily as needed.     metoprolol succinate (TOPROL-XL) 25 MG 24 hr tablet TAKE 1/2 TABLET BY MOUTH EVERY DAY 15 tablet 3   prednisoLONE acetate (PRED FORTE) 1 % ophthalmic suspension Place 1 drop into the left eye daily as needed (redness and swelling).       zinc gluconate 50 MG tablet Take 50 mg by mouth 2 (two) times a week.     No current facility-administered medications for this encounter.    No Known Allergies  Social History   Socioeconomic History   Marital status: Married    Spouse name: Not on file   Number of children: Not on file   Years of education: Not on file   Highest education level: Not on file  Occupational History   Not on file  Tobacco Use   Smoking status: Never   Smokeless tobacco: Never  Vaping Use   Vaping Use: Never used  Substance and Sexual Activity   Alcohol use: Yes    Alcohol/week: 2.0 standard drinks    Types: 2 Cans of beer per week    Comment: occasional/social   Drug use: Never   Sexual activity: Yes  Other Topics Concern   Not on file  Social History Narrative   Lives in [facility]   Social Determinants of Health   Financial Resource Strain: Not on file  Food Insecurity: Not on file  Transportation Needs: Not on file  Physical Activity: Not on file  Stress: Not on file  Social Connections: Not on file  Intimate Partner Violence: Not on file     ROS- All systems are reviewed and negative except as per the HPI above.  Physical Exam: Vitals:   12/12/20 0846  BP: 126/82  Pulse: 63  Weight: 96.9 kg  Height: 6\' 2"  (1.88 m)    GEN- The patient is a well appearing male, alert and oriented x 3 today.   HEENT-head normocephalic, atraumatic, sclera clear, conjunctiva pink, hearing intact, trachea midline. Lungs- Clear to ausculation bilaterally, normal work of breathing Heart- Regular rate and rhythm, no murmurs, rubs or gallops  GI- soft, NT, ND, + BS Extremities- no clubbing, cyanosis, or edema MS- no significant deformity or atrophy Skin- no rash or lesion Psych- euthymic mood, full affect Neuro- strength and sensation are intact   Wt Readings from Last 3 Encounters:  12/12/20 96.9 kg  09/30/20 97.7 kg  07/01/20 91.8 kg    EKG today demonstrates   SR Vent. rate 63 BPM PR interval 152 ms QRS duration 98 ms QT/QTcB 436/446 ms  Echo 06/08/19 demonstrated  1. Left ventricular ejection fraction, by estimation, is 60 to 65%. The  left ventricle has normal function. The left ventricle has no regional  wall motion abnormalities. LV diastolic filing cannot be assessed due to underlying atrial fibrillation.   2. Right ventricular systolic function is normal. The right ventricular  size is normal. Tricuspid regurgitation signal is inadequate for assessing PA pressure.   3. The mitral valve is normal in structure and function. No evidence of mitral valve regurgitation. No evidence of mitral stenosis.   4. The aortic valve is tricuspid. Aortic valve regurgitation is not  visualized. Mild aortic valve sclerosis is present, with no evidence of  aortic valve stenosis.   5. The inferior vena cava is dilated in size with >50% respiratory  variability, suggesting right atrial pressure of 8 mmHg.   Epic records are reviewed at length today  CHA2DS2-VASc Score =  0 The patient's score is based upon: CHF History: No HTN History: No Age : < 65 Diabetes History: No Stroke History: No Vascular Disease History: No Gender: Male   ASSESSMENT AND PLAN: 1. Persistent Atrial Fibrillation/atrial flutter  The patient's CHA2DS2-VASc score is 0, indicating a 0.2% annual risk of stroke.   S/p afib and typical flutter ablation with Dr Johney Frame on 01/02/20. We discussed therapeutic options today. Will start flecainide 50 mg BID. If he continues to have frequent episodes, will increase dose vs repeat ablation.  Continue Toprol 12.5 mg daily.  2. OSA Patient reports compliance with CPAP therapy. Followed by Dr Earl Gala.    Follow up in the AF clinic next week for ECG.   [name] PA-C Afib Clinic [facility] [address] [place], [place] [address] [phone] 12/12/2020 8:53 AM

## 2020-12-12 ENCOUNTER — Ambulatory Visit (HOSPITAL_COMMUNITY)
Admission: RE | Admit: 2020-12-12 | Discharge: 2020-12-12 | Disposition: A | Discharge status: Home / Self Care | Payer: 59 | Source: Ambulatory Visit | Attending: Physician Assistant | Admitting: Physician Assistant

## 2020-12-12 ENCOUNTER — Other Ambulatory Visit: Payer: Self-pay

## 2020-12-12 ENCOUNTER — Encounter (HOSPITAL_COMMUNITY): Payer: Self-pay | Admitting: Physician Assistant

## 2020-12-12 VITALS — BP 126/82 | HR 63 | Ht 74.0 in | Wt 213.6 lb

## 2020-12-12 DIAGNOSIS — Z79899 Other long term (current) drug therapy: Secondary | ICD-10-CM | POA: Diagnosis not present

## 2020-12-12 DIAGNOSIS — I484 Atypical atrial flutter: Secondary | ICD-10-CM

## 2020-12-12 DIAGNOSIS — I4819 Other persistent atrial fibrillation: Secondary | ICD-10-CM | POA: Insufficient documentation

## 2020-12-12 DIAGNOSIS — Z87442 Personal history of urinary calculi: Secondary | ICD-10-CM | POA: Insufficient documentation

## 2020-12-12 DIAGNOSIS — Z8249 Family history of ischemic heart disease and other diseases of the circulatory system: Secondary | ICD-10-CM | POA: Insufficient documentation

## 2020-12-12 DIAGNOSIS — Z9989 Dependence on other enabling machines and devices: Secondary | ICD-10-CM | POA: Insufficient documentation

## 2020-12-12 DIAGNOSIS — G4733 Obstructive sleep apnea (adult) (pediatric): Secondary | ICD-10-CM | POA: Insufficient documentation

## 2020-12-12 NOTE — Patient Instructions (Signed)
Start Flecainide 50mg twice a day 

## 2020-12-17 ENCOUNTER — Other Ambulatory Visit: Payer: Self-pay

## 2020-12-17 ENCOUNTER — Ambulatory Visit (HOSPITAL_COMMUNITY)
Admission: RE | Admit: 2020-12-17 | Discharge: 2020-12-17 | Disposition: A | Discharge status: Home / Self Care | Payer: 59 | Source: Ambulatory Visit | Attending: Physician Assistant | Admitting: Physician Assistant

## 2020-12-17 DIAGNOSIS — I484 Atypical atrial flutter: Secondary | ICD-10-CM | POA: Insufficient documentation

## 2020-12-17 MED ORDER — METOPROLOL SUCCINATE ER 25 MG PO TB24: 12.5000 mg | ORAL_TABLET | Freq: Every day | ORAL | 3 refills | Status: DC

## 2020-12-17 MED ORDER — FLECAINIDE ACETATE 100 MG PO TABS: 100.0000 mg | ORAL_TABLET | Freq: Two times a day (BID) | ORAL | 1 refills | Status: DC

## 2020-12-17 NOTE — Patient Instructions (Signed)
Increase flecainide to 100mg twice a day 

## 2020-12-17 NOTE — Progress Notes (Signed)
Patient returns for ECG after starting BID flecainide. ECG shows SB, PR 160, QRS 100, QTc 435. Patient continues to have breakthrough tachypalpitations. Will increase dose to 100 mg BID. If this is ineffective, will refer back to Dr Johney Frame for repeat ablation consideration. F/u in two weeks.

## 2020-12-31 ENCOUNTER — Other Ambulatory Visit: Payer: Self-pay

## 2020-12-31 ENCOUNTER — Ambulatory Visit (HOSPITAL_COMMUNITY)
Admission: RE | Admit: 2020-12-31 | Discharge: 2020-12-31 | Disposition: A | Discharge status: Home / Self Care | Payer: 59 | Source: Ambulatory Visit | Attending: Physician Assistant | Admitting: Physician Assistant

## 2020-12-31 ENCOUNTER — Encounter (HOSPITAL_COMMUNITY): Payer: Self-pay | Admitting: Physician Assistant

## 2020-12-31 VITALS — BP 142/100 | HR 105 | Ht 74.0 in | Wt 212.8 lb

## 2020-12-31 DIAGNOSIS — I4892 Unspecified atrial flutter: Secondary | ICD-10-CM | POA: Diagnosis not present

## 2020-12-31 DIAGNOSIS — Z79899 Other long term (current) drug therapy: Secondary | ICD-10-CM | POA: Insufficient documentation

## 2020-12-31 DIAGNOSIS — G4733 Obstructive sleep apnea (adult) (pediatric): Secondary | ICD-10-CM | POA: Insufficient documentation

## 2020-12-31 DIAGNOSIS — I484 Atypical atrial flutter: Secondary | ICD-10-CM

## 2020-12-31 DIAGNOSIS — I4819 Other persistent atrial fibrillation: Secondary | ICD-10-CM | POA: Insufficient documentation

## 2020-12-31 DIAGNOSIS — Z7901 Long term (current) use of anticoagulants: Secondary | ICD-10-CM | POA: Diagnosis not present

## 2020-12-31 DIAGNOSIS — Z8249 Family history of ischemic heart disease and other diseases of the circulatory system: Secondary | ICD-10-CM | POA: Diagnosis not present

## 2020-12-31 NOTE — Progress Notes (Signed)
Primary Care Physician: Joycelyn Rua, MD Primary Cardiologist: none Primary Electrophysiologist: Dr Johney Frame Referring Physician: Dr Boris Sharper is a 55 y.o. male with a history of nephrolithiasis, OSA, and atrial fibrillation who presents for follow up in the [facility]. The patient was initially diagnosed with atrial fibrillation 06/08/19 after presenting for a ESWL for kidney stones. Afib with RVR noted incidentally and he was asymptomatic. Patient is not on anticoagulation with a CHADS2VASC score of 0. He was started on metoprolol and discharged in rate controlled afib. Patient reports that he may feel a "flutter" occasionally but he denies any other symptoms. He denies significant alcohol use. Patient is s/p unsuccessful DCCV on 08/08/19. Patient has started using his CPAP machine. Patient presented for DCCV on 08/30/19 and was found to be in SR after increasing flecainide. Unfortunately, he was back in rate controlled atrial flutter on follow up. Patient is s/p afib and flutter ablation with Dr Johney Frame on 01/02/20. He continued to have symptoms of palpitations and a 14 day Zio monitor was placed. Which showed 49% atrial flutter burden. He continues to have frequent symptoms of palpitations.   On follow up today, patient did note an improvement after increasing the flecainide but unfortunately went back into atrial flutter this morning (confirmed on smart watch) with palpitations. There was not a trigger he could identify.   Today, he denies symptoms of chest pain, shortness of breath, orthopnea, PND, lower extremity edema, dizziness, presyncope, syncope, bleeding, or neurologic sequela. The patient is tolerating medications without difficulties and is otherwise without complaint today.    Atrial Fibrillation Risk Factors:  he does have symptoms or diagnosis of sleep apnea.  He is compliant with CPAP therapy. he does not have a history of rheumatic  fever. he does not have a history of alcohol use. The patient does not have a history of early familial atrial fibrillation or other arrhythmias.  he has a BMI of Body mass index is 27.32 kg/m.Marland Kitchen Filed Weights   12/31/20 0845  Weight: 96.5 kg      Family History  Problem Relation Age of Onset   Hypertension Mother      Atrial Fibrillation Management history:  Previous antiarrhythmic drugs: flecainide Previous cardioversions: 08/08/19 Previous ablations: 01/02/20 CHADS2VASC score: 0 Anticoagulation history: Xarelto   Past Medical History:  Diagnosis Date   Dysrhythmia    hx a fib with ablation   Gallstones    History of kidney stones    Persistent atrial fibrillation (HCC)    Sleep apnea    uses CPAP   Past Surgical History:  Procedure Laterality Date   ATRIAL FIBRILLATION ABLATION N/A 01/02/2020   Procedure: ATRIAL FIBRILLATION ABLATION;  Surgeon: Hillis Range, MD;  Location: [facility]AS[facility] CV LAB;  Service: Cardiovascular;  Laterality: N/A;   CARDIOVERSION N/A 08/08/2019   Procedure: CARDIOVERSION;  Surgeon: Chilton Si, MD;  Location: [facility] ENDOSCOPY;  Service: Cardiovascular;  Laterality: N/A;   CHOLECYSTECTOMY N/A 05/16/2020   Procedure: LAPAROSCOPIC CHOLECYSTECTOMY WITH INTRAOPERATIVE CHOLANGIOGRAM AND UMBILICAL HERNIA REPAIR;  Surgeon: Gaynelle Adu, MD;  Location: [facility] ORS;  Service: General;  Laterality: N/A;   CYST EXCISION     removed back of leg   EXTRACORPOREAL SHOCK WAVE LITHOTRIPSY Left 06/08/2019   Procedure: EXTRACORPOREAL SHOCK WAVE LITHOTRIPSY (ESWL);  Surgeon: Noel Christmas, MD;  Location: [facility];  Service: Urology;  Laterality: Left;    Current Outpatient Medications  Medication Sig Dispense Refill   flecainide (  TAMBOCOR) 100 MG tablet Take 1 tablet (100 mg total) by mouth 2 (two) times daily. 60 tablet 1   magnesium 30 MG tablet Take 30 mg by mouth daily as needed.     metoprolol succinate (TOPROL-XL) 25 MG 24 hr tablet Take  0.5 tablets (12.5 mg total) by mouth daily. 15 tablet 3   prednisoLONE acetate (PRED FORTE) 1 % ophthalmic suspension Place 1 drop into the left eye daily as needed (redness and swelling).      zinc gluconate 50 MG tablet Take 50 mg by mouth 2 (two) times a week.     No current facility-administered medications for this encounter.    No Known Allergies  Social History   Socioeconomic History   Marital status: Married    Spouse name: Not on file   Number of children: Not on file   Years of education: Not on file   Highest education level: Not on file  Occupational History   Not on file  Tobacco Use   Smoking status: Never   Smokeless tobacco: Never  Vaping Use   Vaping Use: Never used  Substance and Sexual Activity   Alcohol use: Yes    Alcohol/week: 2.0 standard drinks    Types: 2 Cans of beer per week    Comment: occasional/social   Drug use: Never   Sexual activity: Yes  Other Topics Concern   Not on file  Social History Narrative   L[place] in [place] [facility]   Social Determinants of Health   Financial Resource Strain: Not on file  Food Insecurity: Not on file  Transportation Needs: Not on file  Physical Activity: Not on file  Stress: Not on file  Social Connections: Not on file  Intimate Partner Violence: Not on file     ROS- All systems are reviewed and negative except as per the HPI above.  Physical Exam: Vitals:   12/31/20 0845  BP: (!) 142/100  Pulse: (!) 105  Weight: 96.5 kg  Height: 6\' 2"  (1.88 m)    GEN- The patient is a well appearing male, alert and oriented x 3 today.   HEENT-head normocephalic, atraumatic, sclera clear, conjunctiva pink, hearing intact, trachea midline. Lungs- Clear to ausculation bilaterally, normal work of breathing Heart- irregular rate and rhythm, no murmurs, rubs or gallops  GI- soft, NT, ND, + BS Extremities- no clubbing, cyanosis, or edema MS- no significant deformity or atrophy Skin- no rash or lesion Psych-  euthymic mood, full affect Neuro- strength and sensation are intact   Wt Readings from Last 3 Encounters:  12/31/20 96.5 kg  12/12/20 96.9 kg  09/30/20 97.7 kg    EKG today demonstrates  Atrial flutter with variable block Vent. rate 105 BPM PR interval * ms QRS duration 114 ms QT/QTcB 388/512 ms  Echo 06/08/19 demonstrated  1. Left ventricular ejection fraction, by estimation, is 60 to 65%. The  left ventricle has normal function. The left ventricle has no regional  wall motion abnormalities. LV diastolic filing cannot be assessed due to underlying atrial fibrillation.   2. Right ventricular systolic function is normal. The right ventricular  size is normal. Tricuspid regurgitation signal is inadequate for assessing PA pressure.   3. The mitral valve is normal in structure and function. No evidence of mitral valve regurgitation. No evidence of mitral stenosis.   4. The aortic valve is tricuspid. Aortic valve regurgitation is not  visualized. Mild aortic valve sclerosis is present, with no evidence of  aortic  valve stenosis.   5. The inferior vena cava is dilated in size with >50% respiratory  variability, suggesting right atrial pressure of 8 mmHg.   Epic records are reviewed at length today  CHA2DS2-VASc Score = 0 The patient's score is based upon: CHF History: No HTN History: No Age : < 65 Diabetes History: No Stroke History: No Vascular Disease History: No Gender: Male   ASSESSMENT AND PLAN: 1. Persistent Atrial Fibrillation/atrial flutter  The patient's CHA2DS2-VASc score is 0, indicating a 0.2% annual risk of stroke.   S/p afib and typical flutter ablation with Dr Johney Frame on 01/02/20. We discussed options today including continuing flecainide, changing AAD (dofetilide) or repeat ablation. Patient agreeable to discuss with primary EP regarding repeat ablation. Information about dofetilide admission also given today.  Continue flecainide 100 mg BID for now. Continue  Toprol 12.5 mg daily  2. OSA Patient reports compliance with CPAP therapy. Followed by Dr Earl Gala.    Follow up with Dr Johney Frame for consideration of repeat ablation vs dofetilide.    [name] PA-C [facility] [address] [place], [place] [address] [phone] 12/31/2020 8:52 AM

## 2021-01-10 ENCOUNTER — Other Ambulatory Visit (HOSPITAL_COMMUNITY): Payer: Self-pay | Admitting: Physician Assistant

## 2021-01-22 ENCOUNTER — Other Ambulatory Visit: Payer: Self-pay

## 2021-01-22 ENCOUNTER — Encounter: Payer: Self-pay | Admitting: *Deleted

## 2021-01-22 ENCOUNTER — Ambulatory Visit (INDEPENDENT_AMBULATORY_CARE_PROVIDER_SITE_OTHER): Payer: 59 | Admitting: Internal Medicine

## 2021-01-22 VITALS — BP 128/80 | HR 55 | Ht 74.0 in | Wt 215.4 lb

## 2021-01-22 DIAGNOSIS — I484 Atypical atrial flutter: Secondary | ICD-10-CM

## 2021-01-22 DIAGNOSIS — Z01818 Encounter for other preprocedural examination: Secondary | ICD-10-CM | POA: Diagnosis not present

## 2021-01-22 DIAGNOSIS — I480 Paroxysmal atrial fibrillation: Secondary | ICD-10-CM

## 2021-01-22 DIAGNOSIS — G4733 Obstructive sleep apnea (adult) (pediatric): Secondary | ICD-10-CM | POA: Diagnosis not present

## 2021-01-22 MED ORDER — RIVAROXABAN 20 MG PO TABS: 20.0000 mg | ORAL_TABLET | Freq: Every day | ORAL | 3 refills | Status: DC

## 2021-01-22 NOTE — Progress Notes (Signed)
PCP: Joycelyn Rua, MD   Primary EP: Dr Johney Frame  [name] is a 55 y.o. male who presents today for routine electrophysiology followup.  Since last being seen in our clinic, the patient reports doing reasonably well.  He has had increasing frequency and duration of afib.  + palpitations and fatigue.  Today, he denies symptoms of chest pain, shortness of breath,  lower extremity edema, dizziness, presyncope, or syncope.  The patient is otherwise without complaint today.   Past Medical History:  Diagnosis Date   Dysrhythmia    hx a fib with ablation   Gallstones    History of kidney stones    Persistent atrial fibrillation (HCC)    Sleep apnea    uses CPAP   Past Surgical History:  Procedure Laterality Date   ATRIAL FIBRILLATION ABLATION N/A 01/02/2020   Procedure: ATRIAL FIBRILLATION ABLATION;  Surgeon: Hillis Range, MD;  Location: [facility] INVASIVE CV LAB;  Service: Cardiovascular;  Laterality: N/A;   CARDIOVERSION N/A 08/08/2019   Procedure: CARDIOVERSION;  Surgeon: Chilton Si, MD;  Location: [facility] ENDOSCOPY;  Service: Cardiovascular;  Laterality: N/A;   CHOLECYSTECTOMY N/A 05/16/2020   Procedure: LAPAROSCOPIC CHOLECYSTECTOMY WITH INTRAOPERATIVE CHOLANGIOGRAM AND UMBILICAL HERNIA REPAIR;  Surgeon: Gaynelle Adu, MD;  Location: [facility] ORS;  Service: General;  Laterality: N/A;   CYST EXCISION     removed back of leg   EXTRACORPOREAL SHOCK WAVE LITHOTRIPSY Left 06/08/2019   Procedure: EXTRACORPOREAL SHOCK WAVE LITHOTRIPSY (ESWL);  Surgeon: Noel Christmas, MD;  Location: [facility];  Service: Urology;  Laterality: Left;    ROS- all systems are reviewed and negatives except as per HPI above  Current Outpatient Medications  Medication Sig Dispense Refill   flecainide (TAMBOCOR) 100 MG tablet TAKE 1 TABLET BY MOUTH TWICE A DAY 60 tablet 1   metoprolol succinate (TOPROL-XL) 25 MG 24 hr tablet Take 0.5 tablets (12.5 mg total) by mouth daily. 15 tablet 3   prednisoLONE  acetate (PRED FORTE) 1 % ophthalmic suspension Place 1 drop into the left eye daily as needed (redness and swelling).      zinc gluconate 50 MG tablet Take 50 mg by mouth 2 (two) times a week.     No current facility-administered medications for this visit.    Physical Exam: Vitals:   01/22/21 1627  BP: 128/80  Pulse: (!) 55  SpO2: 99%  Weight: 215 lb 6.4 oz (97.7 kg)  Height: 6\' 2"  (1.88 m)    GEN- The patient is well appearing, alert and oriented x 3 today.   Head- normocephalic, atraumatic Eyes-  Sclera clear, conjunctiva pink Ears- hearing intact Oropharynx- clear Lungs- Clear to ausculation bilaterally, normal work of breathing Heart- Regular rate and rhythm, no murmurs, rubs or gallops, PMI not laterally displaced GI- soft, NT, ND, + BS Extremities- no clubbing, cyanosis, or edema  Wt Readings from Last 3 Encounters:  01/22/21 215 lb 6.4 oz (97.7 kg)  12/31/20 212 lb 12.8 oz (96.5 kg)  12/12/20 213 lb 9.6 oz (96.9 kg)    EKG tracing ordered today is personally reviewed and shows sinus rhythm 55 bpm  Assessment and Plan:  Paroxysmal atrial fibrillation/ atrial flutter He has been having increased afib post ablation The patient has symptomatic, recurrent atrial fibrillation.  Recent Zio reveals both afib and atrial flutter he has failed medical therapy with flecainide. Chads2vasc score is 0.   Therapeutic strategies for afib including medicine and ablation were discussed in detail with the patient today. Risk,  benefits, and alternatives to repeat EP study and radiofrequency ablation for afib were also discussed in detail today. These risks include but are not limited to stroke, bleeding, vascular damage, tamponade, perforation, damage to the esophagus, lungs, and other structures, pulmonary vein stenosis, worsening renal function, and death. The patient understands these risk and wishes to proceed.  We will therefore proceed with catheter ablation at the next available  time.  Carto, ICE, anesthesia are requested for the procedure.  Will also obtain cardiac CT prior to the procedure to exclude LAA thrombus and further evaluate atrial anatomy.  We will start xarelto 20mg  daily today prior to the ablation.   Risks, benefits and potential toxicities for medications prescribed and/or refilled reviewed with patient today.   MD, [facility] 01/22/2021 4:44 PM

## 2021-01-22 NOTE — Patient Instructions (Addendum)
Medication Instructions:  Xarelto 20 mg daily Your physician recommends that you continue on your current medications as directed. Please refer to the Current Medication list given to you today. *If you need a refill on your cardiac medications before your next appointment, please call your pharmacy*  Lab Work: None. If you have labs (blood work) drawn today and your tests are completely normal, you will receive your results only by: MyChart Message (if you have MyChart) OR A paper copy in the mail If you have any lab test that is abnormal or we need to change your treatment, we will call you to review the results.  Testing/Procedures: Your physician has requested that you have cardiac CT. Cardiac computed tomography (CT) is a painless test that uses an x-ray machine to take clear, detailed pictures of your heart. For further information please visit [email or web address] Please follow instruction sheet as given.   Your physician has recommended that you have an ablation. Catheter ablation is a medical procedure used to treat some cardiac arrhythmias (irregular heartbeats). During catheter ablation, a long, thin, flexible tube is put into a blood vessel in your groin (upper thigh), or neck. This tube is called an ablation catheter. It is then guided to your heart through the blood vessel. Radio frequency waves destroy small areas of heart tissue where abnormal heartbeats may cause an arrhythmia to start. Please see the instruction sheet given to you today.   Follow-Up: At [facility], you and your health needs are our priority.  As part of our continuing mission to provide you with exceptional heart care, we have created designated Provider Care Teams.  These Care Teams include your primary Cardiologist (physician) and Advanced Practice Providers (APPs -  Physician Assistants and Nurse Practitioners) who all work together to provide you with the care you need, when you need it.  We recommend  signing up for the patient portal called "MyChart".  Sign up information is provided on this After Visit Summary.  MyChart is used to connect with patients for Virtual Visits (Telemedicine).  Patients are able to view lab/test results, encounter notes, upcoming appointments, etc.  Non-urgent messages can be sent to your provider as well.   To learn more about what you can do with MyChart, go to [email or web address].    Any Other Special Instructions Will Be Listed Below (If Applicable).  Cardiac Ablation Cardiac ablation is a procedure to destroy (ablate) some heart tissue that is sending bad signals. These bad signals cause problems in heart rhythm. The heart has many areas that make these signals. If there are problems in these areas, they can make the heart beat in a way that is not normal. Destroying some tissues can help make the heart rhythm normal. Tell your doctor about: Any allergies you have. All medicines you are taking. These include vitamins, herbs, eye drops, creams, and over-the-counter medicines. Any problems you or family members have had with medicines that make you fall asleep (anesthetics). Any blood disorders you have. Any surgeries you have had. Any medical conditions you have, such as kidney failure. Whether you are pregnant or may be pregnant. What are the risks? This is a safe procedure. But problems may occur, including: Infection. Bruising and bleeding. Bleeding into the chest. Stroke or blood clots. Damage to nearby areas of your body. Allergies to medicines or dyes. The need for a pacemaker if the normal system is damaged. Failure of the procedure to treat the problem. What happens before the procedure? Medicines Ask your  doctor about: Changing or stopping your normal medicines. This is important. Taking aspirin and ibuprofen. Do not take these medicines unless your doctor tells you to take them. Taking other medicines, vitamins, herbs, and  supplements. General instructions Follow instructions from your doctor about what you cannot eat or drink. Plan to have someone take you home from the hospital or clinic. If you will be going home right after the procedure, plan to have someone with you for 24 hours. Ask your doctor what steps will be taken to prevent infection. What happens during the procedure?  An IV tube will be put into one of your veins. You will be given a medicine to help you relax. The skin on your neck or groin will be numbed. A cut (incision) will be made in your neck or groin. A needle will be put through your cut and into a large vein. A tube (catheter) will be put into the needle. The tube will be moved to your heart. Dye may be put through the tube. This helps your doctor see your heart. Small devices (electrodes) on the tube will send out signals. A type of energy will be used to destroy some heart tissue. The tube will be taken out. Pressure will be held on your cut. This helps stop bleeding. A bandage will be put over your cut. The exact procedure may vary among doctors and hospitals. What happens after the procedure? You will be watched until you leave the hospital or clinic. This includes checking your heart rate, breathing rate, oxygen, and blood pressure. Your cut will be watched for bleeding. You will need to lie still for a few hours. Do not drive for 24 hours or as long as your doctor tells you. Summary Cardiac ablation is a procedure to destroy some heart tissue. This is done to treat heart rhythm problems. Tell your doctor about any medical conditions you may have. Tell him or her about all medicines you are taking to treat them. This is a safe procedure. But problems may occur. These include infection, bruising, bleeding, and damage to nearby areas of your body. Follow what your doctor tells you about food and drink. You may also be told to change or stop some of your medicines. After the  procedure, do not drive for 24 hours or as long as your doctor tells you. This information is not intended to replace advice given to you by your health care provider. Make sure you discuss any questions you have with your health care provider. Document Revised: 03/02/2019 Document Reviewed: 03/02/2019 Elsevier Patient Education  2022 [employer].

## 2021-02-06 ENCOUNTER — Other Ambulatory Visit (HOSPITAL_COMMUNITY): Payer: Self-pay | Admitting: Physician Assistant

## 2021-02-12 ENCOUNTER — Other Ambulatory Visit (HOSPITAL_COMMUNITY): Payer: Self-pay | Admitting: Internal Medicine

## 2021-02-13 ENCOUNTER — Telehealth: Payer: Self-pay | Admitting: Internal Medicine

## 2021-02-13 NOTE — Telephone Encounter (Signed)
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 please advise.

## 2021-02-13 NOTE — Telephone Encounter (Signed)
Pt is scheduled for a fib ablation 12/6 Will forward to Dr Johney Frame for review and recommendations if okay for pt to proceed with filling that soon after ablation or how long should wait to have fillings done .[name]

## 2021-02-15 NOTE — Telephone Encounter (Signed)
Please advise that I am ok with him having the procedure as long as anticoagulation does not have to be interrupted

## 2021-02-17 NOTE — Telephone Encounter (Signed)
Gave recommendations.  Verbalized understanding.

## 2021-02-28 ENCOUNTER — Other Ambulatory Visit: Payer: Self-pay

## 2021-02-28 ENCOUNTER — Other Ambulatory Visit: Payer: 59 | Admitting: *Deleted

## 2021-02-28 DIAGNOSIS — G4733 Obstructive sleep apnea (adult) (pediatric): Secondary | ICD-10-CM

## 2021-02-28 DIAGNOSIS — I480 Paroxysmal atrial fibrillation: Secondary | ICD-10-CM

## 2021-02-28 DIAGNOSIS — I484 Atypical atrial flutter: Secondary | ICD-10-CM

## 2021-02-28 LAB — BASIC METABOLIC PANEL
BUN/Creatinine Ratio: 10 (ref 9–20)
BUN: 13 mg/dL (ref 6–24)
CO2: 24 mmol/L (ref 20–29)
Calcium: 9.5 mg/dL (ref 8.7–10.2)
Chloride: 103 mmol/L (ref 96–106)
Creatinine, Ser: 1.33 mg/dL — ABNORMAL HIGH (ref 0.76–1.27)
Glucose: 105 mg/dL — ABNORMAL HIGH (ref 70–99)
Potassium: 4.4 mmol/L (ref 3.5–5.2)
Sodium: 141 mmol/L (ref 134–144)
eGFR: 64 mL/min/{1.73_m2} (ref >59)

## 2021-02-28 LAB — CBC WITH DIFFERENTIAL/PLATELET
Basophils Absolute: 0.1 10*3/uL (ref 0.0–0.2)
Basos: 1 %
EOS (ABSOLUTE): 0.1 10*3/uL (ref 0.0–0.4)
Eos: 1 %
Hematocrit: 49.2 % (ref 37.5–51.0)
Hemoglobin: 16.7 g/dL (ref 13.0–17.7)
Immature Grans (Abs): 0 10*3/uL (ref 0.0–0.1)
Immature Granulocytes: 0 %
Lymphocytes Absolute: 2 10*3/uL (ref 0.7–3.1)
Lymphs: 24 %
MCH: 28.7 pg (ref 26.6–33.0)
MCHC: 33.9 g/dL (ref 31.5–35.7)
MCV: 85 fL (ref 79–97)
Monocytes Absolute: 0.8 10*3/uL (ref 0.1–0.9)
Monocytes: 9 %
Neutrophils Absolute: 5.4 10*3/uL (ref 1.4–7.0)
Neutrophils: 65 %
Platelets: 269 10*3/uL (ref 150–450)
RBC: 5.82 x10E6/uL — ABNORMAL HIGH (ref 4.14–5.80)
RDW: 12.7 % (ref 11.6–15.4)
WBC: 8.3 10*3/uL (ref 3.4–10.8)

## 2021-03-11 ENCOUNTER — Telehealth (HOSPITAL_COMMUNITY): Payer: Self-pay | Admitting: Emergency Medicine

## 2021-03-11 ENCOUNTER — Telehealth: Payer: Self-pay | Admitting: Internal Medicine

## 2021-03-11 NOTE — Telephone Encounter (Signed)
   Pt has blood in his urine and would like to know if he needs to hold his blood thinner especially he is scheduled for CT tomorrow

## 2021-03-11 NOTE — Telephone Encounter (Signed)
Reaching out to patient to offer assistance regarding upcoming cardiac imaging study; pt verbalizes understanding of appt date/time, parking situation and where to check in, pre-test NPO status and medications ordered, and verified current allergies; name and call back number provided for further questions should they arise Rockwell Alexandria RN Navigator Cardiac Imaging [facility] [phone] office [phone] cell  Arrival 12:30p Denies iv issues Daily meds (HR 55)

## 2021-03-11 NOTE — Telephone Encounter (Signed)
Blood in urine today after lunch. The patient has voided 3 times total since blood started, urine more brown tinged, not red. Taking Xarelto for upcoming procedure. No other urine symptoms. Will get advisement from [name] and call patient back.  Verbalized understanding.

## 2021-03-11 NOTE — Telephone Encounter (Signed)
Advised patient per MD Allred to continue Xarelto this evening will follow up in the morning for an update.  Verbalized understanding.

## 2021-03-12 ENCOUNTER — Ambulatory Visit (HOSPITAL_COMMUNITY)
Admission: RE | Admit: 2021-03-12 | Discharge: 2021-03-12 | Disposition: A | Discharge status: Home / Self Care | Payer: 59 | Source: Ambulatory Visit | Attending: Internal Medicine | Admitting: Internal Medicine

## 2021-03-12 ENCOUNTER — Other Ambulatory Visit: Payer: Self-pay

## 2021-03-12 DIAGNOSIS — I480 Paroxysmal atrial fibrillation: Secondary | ICD-10-CM

## 2021-03-12 MED ORDER — METOPROLOL TARTRATE 5 MG/5ML IV SOLN
5.0000 mg | INTRAVENOUS | Status: DC | PRN
  Administered 2021-03-12: 5 mg via INTRAVENOUS

## 2021-03-12 MED ORDER — DILTIAZEM HCL 25 MG/5ML IV SOLN: 5.0000 mg | Freq: Once | INTRAVENOUS | Status: AC

## 2021-03-12 MED ORDER — IOHEXOL 350 MG/ML SOLN
100.0000 mL | Freq: Once | INTRAVENOUS | Status: AC | PRN
  Administered 2021-03-12: 100 mL via INTRAVENOUS

## 2021-03-12 MED ORDER — METOPROLOL TARTRATE 5 MG/5ML IV SOLN
INTRAVENOUS | Status: AC
  Administered 2021-03-12: 5 mg via INTRAVENOUS
  Filled 2021-03-12: qty 10

## 2021-03-12 MED ORDER — DILTIAZEM HCL 25 MG/5ML IV SOLN
INTRAVENOUS | Status: AC
  Administered 2021-03-12: 5 mg via INTRAVENOUS
  Filled 2021-03-12: qty 5

## 2021-03-12 NOTE — Telephone Encounter (Signed)
The patient urine cleared last night before bed and is still clear with not blood present this am. Advised patient to continue as planned with CT today and up coming procedure, will update MD Allred. Reminded not to miss any doses of Xarelto and to call back if he has anymore bleeding issues.  Verbalized understanding.

## 2021-03-17 NOTE — Pre-Procedure Instructions (Signed)
Attempted to call patient regarding procedure instructions for tomorrow.  Left voicemail on the following items: Arrival time 0830 Nothing to eat or drink after midnight No meds AM of procedure Responsible person to drive you home and stay with you for 24 hrs  Have you missed any doses of anti-coagulant Xarelto- take dose tonight

## 2021-03-18 ENCOUNTER — Encounter (HOSPITAL_COMMUNITY): Payer: Self-pay | Admitting: Internal Medicine

## 2021-03-18 ENCOUNTER — Ambulatory Visit (HOSPITAL_COMMUNITY): Payer: 59 | Admitting: Anesthesiology

## 2021-03-18 ENCOUNTER — Ambulatory Visit (HOSPITAL_COMMUNITY)
Admission: RE | Admit: 2021-03-18 | Discharge: 2021-03-18 | Disposition: A | Discharge status: Home / Self Care | Payer: 59 | Attending: Internal Medicine | Admitting: Internal Medicine

## 2021-03-18 ENCOUNTER — Encounter (HOSPITAL_COMMUNITY)
Admission: RE | Disposition: A | Discharge status: Home / Self Care | Payer: 59 | Source: Home / Self Care | Attending: Internal Medicine

## 2021-03-18 ENCOUNTER — Other Ambulatory Visit: Payer: Self-pay

## 2021-03-18 DIAGNOSIS — I484 Atypical atrial flutter: Secondary | ICD-10-CM | POA: Insufficient documentation

## 2021-03-18 DIAGNOSIS — I4819 Other persistent atrial fibrillation: Secondary | ICD-10-CM | POA: Diagnosis present

## 2021-03-18 DIAGNOSIS — I4892 Unspecified atrial flutter: Secondary | ICD-10-CM

## 2021-03-18 HISTORY — PX: ATRIAL FIBRILLATION ABLATION: EP1191

## 2021-03-18 LAB — POCT ACTIVATED CLOTTING TIME
Activated Clotting Time: 300 seconds
Activated Clotting Time: 306 seconds

## 2021-03-18 SURGERY — ATRIAL FIBRILLATION ABLATION
Anesthesia: General

## 2021-03-18 MED ORDER — SUCCINYLCHOLINE CHLORIDE 200 MG/10ML IV SOSY
PREFILLED_SYRINGE | INTRAVENOUS | Status: DC | PRN
  Administered 2021-03-18: 100 mg via INTRAVENOUS

## 2021-03-18 MED ORDER — HYDROCODONE-ACETAMINOPHEN 5-325 MG PO TABS: 1.0000 | ORAL_TABLET | ORAL | Status: DC | PRN

## 2021-03-18 MED ORDER — SODIUM CHLORIDE 0.9% FLUSH: 3.0000 mL | Freq: Two times a day (BID) | INTRAVENOUS | Status: DC

## 2021-03-18 MED ORDER — SUGAMMADEX SODIUM 200 MG/2ML IV SOLN
INTRAVENOUS | Status: DC | PRN
Start: 2021-03-18 — End: 2021-03-18
  Administered 2021-03-18: 200 mg via INTRAVENOUS

## 2021-03-18 MED ORDER — HEPARIN SODIUM (PORCINE) 1000 UNIT/ML IJ SOLN
INTRAMUSCULAR | Status: AC
  Filled 2021-03-18: qty 10

## 2021-03-18 MED ORDER — LIDOCAINE 2% (20 MG/ML) 5 ML SYRINGE
INTRAMUSCULAR | Status: DC | PRN
Start: 2021-03-18 — End: 2021-03-18
  Administered 2021-03-18: 60 mg via INTRAVENOUS

## 2021-03-18 MED ORDER — PROPOFOL 10 MG/ML IV BOLUS
INTRAVENOUS | Status: DC | PRN
  Administered 2021-03-18: 40 mg via INTRAVENOUS
  Administered 2021-03-18: 160 mg via INTRAVENOUS

## 2021-03-18 MED ORDER — HEPARIN SODIUM (PORCINE) 1000 UNIT/ML IJ SOLN
INTRAMUSCULAR | Status: DC | PRN
  Administered 2021-03-18: 2000 [IU] via INTRAVENOUS
  Administered 2021-03-18: 3000 [IU] via INTRAVENOUS

## 2021-03-18 MED ORDER — SODIUM CHLORIDE 0.9 % IV SOLN: INTRAVENOUS | Status: DC

## 2021-03-18 MED ORDER — ONDANSETRON HCL 4 MG/2ML IJ SOLN: 4.0000 mg | Freq: Four times a day (QID) | INTRAMUSCULAR | Status: DC | PRN

## 2021-03-18 MED ORDER — HEPARIN (PORCINE) IN NACL 2000-0.9 UNIT/L-% IV SOLN
INTRAVENOUS | Status: DC | PRN
  Administered 2021-03-18: 1000 mL

## 2021-03-18 MED ORDER — PANTOPRAZOLE SODIUM 40 MG PO TBEC: 40.0000 mg | DELAYED_RELEASE_TABLET | Freq: Every day | ORAL | 0 refills | Status: DC

## 2021-03-18 MED ORDER — PHENYLEPHRINE HCL-NACL 20-0.9 MG/250ML-% IV SOLN
INTRAVENOUS | Status: DC | PRN
  Administered 2021-03-18: 25 ug/min via INTRAVENOUS

## 2021-03-18 MED ORDER — ACETAMINOPHEN 325 MG PO TABS: 650.0000 mg | ORAL_TABLET | ORAL | Status: DC | PRN

## 2021-03-18 MED ORDER — HEPARIN SODIUM (PORCINE) 1000 UNIT/ML IJ SOLN
INTRAMUSCULAR | Status: DC | PRN
  Administered 2021-03-18: 15000 [IU] via INTRAVENOUS

## 2021-03-18 MED ORDER — PROTAMINE SULFATE 10 MG/ML IV SOLN
INTRAVENOUS | Status: DC | PRN
  Administered 2021-03-18: 20 mg via INTRAVENOUS
  Administered 2021-03-18 (×2): 10 mg via INTRAVENOUS

## 2021-03-18 MED ORDER — FENTANYL CITRATE (PF) 250 MCG/5ML IJ SOLN
INTRAMUSCULAR | Status: DC | PRN
  Administered 2021-03-18 (×2): 50 ug via INTRAVENOUS

## 2021-03-18 MED ORDER — MIDAZOLAM HCL 5 MG/5ML IJ SOLN
INTRAMUSCULAR | Status: DC | PRN
  Administered 2021-03-18: 2 mg via INTRAVENOUS

## 2021-03-18 MED ORDER — DEXAMETHASONE SODIUM PHOSPHATE 10 MG/ML IJ SOLN
INTRAMUSCULAR | Status: DC | PRN
  Administered 2021-03-18: 10 mg via INTRAVENOUS

## 2021-03-18 MED ORDER — HEPARIN (PORCINE) IN NACL 1000-0.9 UT/500ML-% IV SOLN
INTRAVENOUS | Status: DC | PRN
  Administered 2021-03-18 (×3): 500 mL

## 2021-03-18 MED ORDER — ROCURONIUM BROMIDE 10 MG/ML (PF) SYRINGE
PREFILLED_SYRINGE | INTRAVENOUS | Status: DC | PRN
  Administered 2021-03-18: 30 mg via INTRAVENOUS
  Administered 2021-03-18: 20 mg via INTRAVENOUS
  Administered 2021-03-18: 10 mg via INTRAVENOUS
  Administered 2021-03-18: 20 mg via INTRAVENOUS

## 2021-03-18 MED ORDER — SODIUM CHLORIDE 0.9 % IV SOLN: 250.0000 mL | INTRAVENOUS | Status: DC | PRN

## 2021-03-18 MED ORDER — ONDANSETRON HCL 4 MG/2ML IJ SOLN
INTRAMUSCULAR | Status: DC | PRN
  Administered 2021-03-18: 4 mg via INTRAVENOUS

## 2021-03-18 MED ORDER — PHENYLEPHRINE 40 MCG/ML (10ML) SYRINGE FOR IV PUSH (FOR BLOOD PRESSURE SUPPORT)
PREFILLED_SYRINGE | INTRAVENOUS | Status: DC | PRN
  Administered 2021-03-18: 80 ug via INTRAVENOUS
  Administered 2021-03-18 (×2): 120 ug via INTRAVENOUS
  Administered 2021-03-18: 80 ug via INTRAVENOUS

## 2021-03-18 MED ORDER — SODIUM CHLORIDE 0.9% FLUSH: 3.0000 mL | INTRAVENOUS | Status: DC | PRN

## 2021-03-18 SURGICAL SUPPLY — 18 items
CATH OCTARAY 2.0 F 3-3-3-3-3 (CATHETERS) ×1 IMPLANT
CATH SMTCH THERMOCOOL SF DF (CATHETERS) ×1 IMPLANT
CATH SOUNDSTAR ECO 8FR (CATHETERS) ×1 IMPLANT
CATH WEBSTER BI DIR CS D-F CRV (CATHETERS) ×1 IMPLANT
CLOSURE PERCLOSE PROSTYLE (VASCULAR PRODUCTS) ×3 IMPLANT
COVER SWIFTLINK CONNECTOR (BAG) ×2 IMPLANT
MAT PREVALON FULL STRYKER (MISCELLANEOUS) ×1 IMPLANT
NDL BAYLIS TRANSSEPTAL 71CM (NEEDLE) IMPLANT
NEEDLE BAYLIS TRANSSEPTAL 71CM (NEEDLE) ×2 IMPLANT
PACK EP LATEX FREE (CUSTOM PROCEDURE TRAY) ×2
PACK EP LF (CUSTOM PROCEDURE TRAY) ×1 IMPLANT
PAD DEFIB RADIO PHYSIO CONN (PAD) ×2 IMPLANT
PATCH CARTO3 (PAD) ×1 IMPLANT
SHEATH PINNACLE 7F 10CM (SHEATH) ×2 IMPLANT
SHEATH PINNACLE 9F 10CM (SHEATH) ×1 IMPLANT
SHEATH PROBE COVER 6X72 (BAG) ×1 IMPLANT
SHEATH SWARTZ TS SL2 63CM 8.5F (SHEATH) ×1 IMPLANT
TUBING SMART ABLATE COOLFLOW (TUBING) ×1 IMPLANT

## 2021-03-18 NOTE — Anesthesia Preprocedure Evaluation (Addendum)
Anesthesia Evaluation  Patient identified by MRN, date of birth, ID band Patient awake    Reviewed: Allergy & Precautions, NPO status , Patient's Chart, lab work & pertinent test results  Airway Mallampati: III  TM Distance: >3 FB Neck ROM: Full  Mouth opening: Limited Mouth Opening  Dental  (+) Dental Advisory Given   Pulmonary sleep apnea ,    breath sounds clear to auscultation       Cardiovascular hypertension, Pt. on home beta blockers + dysrhythmias Atrial Fibrillation  Rhythm:Regular Rate:Normal     Neuro/Psych negative neurological ROS     GI/Hepatic negative GI ROS, Neg liver ROS,   Endo/Other  negative endocrine ROS  Renal/GU negative Renal ROS     Musculoskeletal   Abdominal   Peds  Hematology negative hematology ROS (+)   Anesthesia Other Findings   Reproductive/Obstetrics                            Lab Results  Component Value Date   WBC 8.3 02/28/2021   HGB 16.7 02/28/2021   HCT 49.2 02/28/2021   MCV 85 02/28/2021   PLT 269 02/28/2021   Lab Results  Component Value Date   CREATININE 1.33 (H) 02/28/2021   BUN 13 02/28/2021   NA 141 02/28/2021   K 4.4 02/28/2021   CL 103 02/28/2021   CO2 24 02/28/2021    Anesthesia Physical Anesthesia Plan  ASA: 2  Anesthesia Plan: General   Post-op Pain Management: Minimal or no pain anticipated   Induction:   PONV Risk Score and Plan: 2 and Dexamethasone, Ondansetron and Treatment may vary due to age or medical condition  Airway Management Planned: Oral ETT and Video Laryngoscope Planned  Additional Equipment: None  Intra-op Plan:   Post-operative Plan: Extubation in OR  Informed Consent: I have reviewed the patients History and Physical, chart, labs and discussed the procedure including the risks, benefits and alternatives for the proposed anesthesia with the patient or authorized representative who has indicated  his/her understanding and acceptance.     Dental advisory given  Plan Discussed with: CRNA  Anesthesia Plan Comments:        Anesthesia Quick Evaluation

## 2021-03-18 NOTE — Transfer of Care (Signed)
Immediate Anesthesia Transfer of Care Note  Patient: [name]  Procedure(s) Performed: ATRIAL FIBRILLATION ABLATION  Patient Location: PACU  Anesthesia Type:General  Level of Consciousness: oriented, drowsy and patient cooperative  Airway & Oxygen Therapy: Patient Spontanous Breathing and Patient connected to nasal cannula oxygen  Post-op Assessment: Report given to RN and Post -op Vital signs reviewed and stable  Post vital signs: Reviewed  Last Vitals:  Vitals Value Taken Time  BP 105/60 03/18/21 1309  Temp    Pulse 78 03/18/21 1311  Resp 15 03/18/21 1311  SpO2 96 % 03/18/21 1311  Vitals shown include unvalidated device data.  Last Pain:  Vitals:   03/18/21 0941  PainSc: 0-No pain         Complications: No notable events documented.

## 2021-03-18 NOTE — Progress Notes (Signed)
Pt ambulated to the BR.  Slight oozing from (R) groin site.  Dressing changed and site observed.  Redressed and walked around the unit. No further oozing. Discharged with wife who will drive pt home and stay with pt x 24 hrs

## 2021-03-18 NOTE — Anesthesia Procedure Notes (Signed)
Procedure Name: Intubation Date/Time: 03/18/2021 10:40 AM Performed by: Lovie Chol, CRNA Pre-anesthesia Checklist: Patient identified, Emergency Drugs available, Suction available and Patient being monitored Patient Re-evaluated:Patient Re-evaluated prior to induction Oxygen Delivery Method: Circle System Utilized Preoxygenation: Pre-oxygenation with 100% oxygen Induction Type: IV induction Ventilation: Mask ventilation without difficulty Laryngoscope Size: Glidescope and 4 Grade View: Grade I Tube type: Oral Tube size: 7.5 mm Number of attempts: 1 Airway Equipment and Method: Stylet, Oral airway and Video-laryngoscopy Placement Confirmation: ETT inserted through vocal cords under direct vision, positive ETCO2 and breath sounds checked- equal and bilateral Secured at: 22 cm Tube secured with: Tape Dental Injury: Teeth and Oropharynx as per pre-operative assessment  Difficulty Due To: Difficulty was anticipated, Difficult Airway- due to anterior larynx and Difficult Airway- due to limited oral opening Future Recommendations: Recommend- induction with short-acting agent, and alternative techniques readily available

## 2021-03-18 NOTE — Anesthesia Postprocedure Evaluation (Signed)
Anesthesia Post Note  Patient: [name]  Procedure(s) Performed: ATRIAL FIBRILLATION ABLATION     Patient location during evaluation: PACU Anesthesia Type: General Level of consciousness: awake and alert Pain management: pain level controlled Vital Signs Assessment: post-procedure vital signs reviewed and stable Respiratory status: spontaneous breathing, nonlabored ventilation, respiratory function stable and patient connected to nasal cannula oxygen Cardiovascular status: blood pressure returned to baseline and stable Postop Assessment: no apparent nausea or vomiting Anesthetic complications: no   No notable events documented.  Last Vitals:  Vitals:   03/18/21 1430 03/18/21 1445  BP: (!) 101/59 101/61  Pulse: 74 77  Resp: 17 17  Temp:    SpO2: 94% 98%    Last Pain:  Vitals:   03/18/21 1311  PainSc: 0-No pain                 Kennieth Rad

## 2021-03-18 NOTE — Discharge Instructions (Signed)
Post procedure care instructions No driving for 4 days. No lifting over 5 lbs for 1 week. No vigorous or sexual activity for 1 week. You may return to work/your usual activities on 03/26/21. Keep procedure site clean & dry. If you notice increased pain, swelling, bleeding or pus, call/return!  You may shower after 24 hours, but no soaking in baths/hot tubs/pools for 1 week.    You have an appointment set up with the Atrial Fibrillation Clinic.  Multiple studies have shown that being followed by a dedicated atrial fibrillation clinic in addition to the standard care you receive from your other physicians improves health. We believe that enrollment in the atrial fibrillation clinic will allow [place] to better care for you.   The phone number to the Atrial Fibrillation Clinic is [phone]. The clinic is staffed Monday through Friday from 8:30am to 5pm.  Parking Directions: The clinic is located in the Heart and Vascular Building connected to [facility]. 1)From [address] turn on to [address] and go to the 3rd entrance  (Heart and Vascular entrance) on the right. 2)Look to the right for Heart &Vascular Parking Garage. 3)A code for the entrance is required, for Jan is 1202.   4)Take the elevators to the 1st floor. Registration is in the room with the glass walls at the end of the hallway.  If you have any trouble parking or locating the clinic, please don't hesitate to call [phone].

## 2021-03-18 NOTE — H&P (Signed)
[name] is a 55 y.o. male who presents today for electrophysiology study and ablation for afib.  Since last being seen in our clinic, the patient reports doing reasonably well.  He has had increasing frequency and duration of afib.  + palpitations and fatigue.  Today, he denies symptoms of chest pain, shortness of breath,  lower extremity edema, dizziness, presyncope, or syncope.  The patient is otherwise without complaint today.        Past Medical History:  Diagnosis Date   Dysrhythmia      hx a fib with ablation   Gallstones     History of kidney stones     Persistent atrial fibrillation ([place])     Sleep apnea      uses CPAP         Past Surgical History:  Procedure Laterality Date   ATRIAL FIBRILLATION ABLATION N/A 01/02/2020    Procedure: ATRIAL FIBRILLATION ABLATION;  Surgeon: Thompson Grayer, MD;  Location: [place] [facility];  Service: Cardiovascular;  Laterality: N/A;   CARDIOVERSION N/A 08/08/2019    Procedure: CARDIOVERSION;  Surgeon: [name], MD;  Location: [place];  Service: Cardiovascular;  Laterality: N/A;   CHOLECYSTECTOMY N/A 05/16/2020    Procedure: LAPAROSCOPIC CHOLECYSTECTOMY WITH INTRAOPERATIVE CHOLANGIOGRAM AND UMBILICAL HERNIA REPAIR;  Surgeon: Greer Pickerel, MD;  Location: [facility];  Service: General;  Laterality: N/A;   CYST EXCISION        removed back of leg   EXTRACORPOREAL SHOCK WAVE LITHOTRIPSY Left 06/08/2019    Procedure: EXTRACORPOREAL SHOCK WAVE LITHOTRIPSY (ESWL);  Surgeon: [name], MD;  Location: [facility];  Service: Urology;  Laterality: Left;      ROS- all systems are reviewed and negatives except as per HPI above         Current Outpatient Medications  Medication Sig Dispense Refill   flecainide (TAMBOCOR) 100 MG tablet TAKE 1 TABLET BY MOUTH TWICE A DAY 60 tablet 1   metoprolol succinate (TOPROL-XL) 25 MG 24 hr tablet Take 0.5 tablets (12.5 mg total) by mouth daily. 15 tablet 3   prednisoLONE acetate  (PRED FORTE) 1 % ophthalmic suspension Place 1 drop into the left eye daily as needed (redness and swelling).        zinc gluconate 50 MG tablet Take 50 mg by mouth 2 (two) times a week.        No current facility-administered medications for this visit.      Physical Exam: Vitals:   03/18/21 0844  BP: 126/87  Pulse: 73  Temp: 98.3 F (36.8 C)  SpO2: 97%    GEN- The patient is well appearing, alert and oriented x 3 today.   Head- normocephalic, atraumatic Eyes-  Sclera clear, conjunctiva pink Ears- hearing intact Oropharynx- clear Lungs- Clear to ausculation bilaterally, normal work of breathing Heart- Regular rate and rhythm, no murmurs, rubs or gallops, PMI not laterally displaced GI- soft, NT, ND, + BS Extremities- no clubbing, cyanosis, or edema      Wt Readings from Last 3 Encounters:  01/22/21 215 lb 6.4 oz (97.7 kg)  12/31/20 212 lb 12.8 oz (96.5 kg)  12/12/20 213 lb 9.6 oz (96.9 kg)      EKG tracing ordered today is personally reviewed and shows sinus rhythm 55 bpm   Assessment and Plan:   Paroxysmal atrial fibrillation/ atrial flutter He has been having increased afib post ablation The patient has symptomatic, recurrent atrial fibrillation.  Recent Zio reveals both afib and atrial  flutter   Risk, benefits, and alternatives to EP study and radiofrequency ablation for afib were again discussed in detail today. These risks include but are not limited to stroke, bleeding, vascular damage, tamponade, perforation, damage to the esophagus, lungs, and other structures, pulmonary vein stenosis, worsening renal function, and death. The patient understands these risk and wishes to proceed.    Cardiac CT reviewed at length with the patient today.  he reports compliance with OAC without interruption.  Hillis Range MD, [facility] 03/18/2021 9:59 AM

## 2021-03-19 ENCOUNTER — Encounter (HOSPITAL_COMMUNITY): Payer: Self-pay | Admitting: Internal Medicine

## 2021-03-25 ENCOUNTER — Telehealth (HOSPITAL_COMMUNITY): Payer: Self-pay | Admitting: *Deleted

## 2021-03-25 NOTE — Telephone Encounter (Signed)
Patient called in stating his apple watch notified him around 4am of elevated heart rate and has continued since that time. His HR is currently 130 BP 112/89. Feels fine. Discussed with [name] PA will take an extra 12.5mg  of metoprolol this evening - touch base in the AM with update of HRs. Pt in agreement.

## 2021-03-26 ENCOUNTER — Encounter (HOSPITAL_COMMUNITY): Payer: Self-pay

## 2021-03-26 NOTE — Telephone Encounter (Signed)
Pt continues in AF heart rates are better controlled today HRs in the mid-90s.  BP 122/85. He would like to continue metoprolol BID through the weekend and if still out next week will bring in for assessment. Pt in agreement.

## 2021-03-28 NOTE — Telephone Encounter (Signed)
Per [name] PA pt can take an extra 50mg  of flecainide tonight but return to normal dosing tomorrow. Pt has appt for Monday if continues out of rhythm.

## 2021-03-31 ENCOUNTER — Ambulatory Visit (HOSPITAL_COMMUNITY)
Admission: RE | Admit: 2021-03-31 | Discharge: 2021-03-31 | Disposition: A | Discharge status: Home / Self Care | Payer: 59 | Source: Ambulatory Visit | Attending: Nurse Practitioner | Admitting: Nurse Practitioner

## 2021-03-31 ENCOUNTER — Encounter (HOSPITAL_COMMUNITY): Payer: Self-pay | Admitting: Nurse Practitioner

## 2021-03-31 ENCOUNTER — Other Ambulatory Visit: Payer: Self-pay

## 2021-03-31 ENCOUNTER — Other Ambulatory Visit (HOSPITAL_COMMUNITY): Payer: Self-pay | Admitting: *Deleted

## 2021-03-31 VITALS — BP 122/96 | HR 102 | Ht 74.0 in | Wt 218.4 lb

## 2021-03-31 DIAGNOSIS — I480 Paroxysmal atrial fibrillation: Secondary | ICD-10-CM

## 2021-03-31 DIAGNOSIS — G4733 Obstructive sleep apnea (adult) (pediatric): Secondary | ICD-10-CM | POA: Insufficient documentation

## 2021-03-31 DIAGNOSIS — I4892 Unspecified atrial flutter: Secondary | ICD-10-CM | POA: Diagnosis not present

## 2021-03-31 DIAGNOSIS — Z79899 Other long term (current) drug therapy: Secondary | ICD-10-CM | POA: Diagnosis not present

## 2021-03-31 DIAGNOSIS — I4819 Other persistent atrial fibrillation: Secondary | ICD-10-CM | POA: Diagnosis present

## 2021-03-31 DIAGNOSIS — Z7901 Long term (current) use of anticoagulants: Secondary | ICD-10-CM | POA: Diagnosis not present

## 2021-03-31 NOTE — H&P (View-Only) (Signed)
Primary Care Physician: Orpah Melter, MD Primary Cardiologist: none Primary Electrophysiologist: Dr Rayann Heman Referring Physician: Dr Collins Scotland is a 55 y.o. male with a history of nephrolithiasis, OSA, and atrial fibrillation who presents for follow up in the [facility]. The patient was initially diagnosed with atrial fibrillation 06/08/19 after presenting for a ESWL for kidney stones. Afib with RVR noted incidentally and he was asymptomatic. Patient is not on anticoagulation with a CHADS2VASC score of 0. He was started on metoprolol and discharged in rate controlled afib.  He denies significant alcohol use. Patient is s/p unsuccessful DCCV on 08/08/19. Patient has started using his CPAP machine. Patient presented for DCCV on 08/30/19 and was found to be in [place] after increasing flecainide. Unfortunately, he was back in rate controlled atrial flutter on follow up.   Patient is s/p afib and flutter ablation with Dr Rayann Heman on 01/02/20 and again on 03/18/21. He maintained SR for about a week and then saw increases in heart rate. He called the office and Toprol 12.5 mg qd was increased to BID. He continues to  have atrial flutter today at 108 bpm.   Today, he denies symptoms of chest pain, shortness of breath, orthopnea, PND, lower extremity edema, dizziness, presyncope, syncope, bleeding, or neurologic sequela. The patient is tolerating medications without difficulties and is otherwise without complaint today.    Atrial Fibrillation Risk Factors:  he does have symptoms or diagnosis of sleep apnea.  He is compliant with CPAP therapy. he does not have a history of rheumatic fever. he does not have a history of alcohol use. The patient does not have a history of early familial atrial fibrillation or other arrhythmias.  he has a BMI of Body mass index is 28.04 kg/m.Marland Kitchen Filed Weights   03/31/21 1537  Weight: 99.1 kg      Family History  Problem Relation  Age of Onset   Hypertension Mother      Atrial Fibrillation Management history:  Previous antiarrhythmic drugs: flecainide Previous cardioversions: 08/08/19 Previous ablations: 01/02/20 CHADS2VASC score: 0 Anticoagulation history: Xarelto   Past Medical History:  Diagnosis Date   Dysrhythmia    hx a fib with ablation   Gallstones    History of kidney stones    Persistent atrial fibrillation ([place])    Sleep apnea    uses CPAP   Past Surgical History:  Procedure Laterality Date   ATRIAL FIBRILLATION ABLATION N/A 01/02/2020   Procedure: ATRIAL FIBRILLATION ABLATION;  Surgeon: Thompson Grayer, MD;  Location: [place] [facility];  Service: Cardiovascular;  Laterality: N/A;   ATRIAL FIBRILLATION ABLATION N/A 03/18/2021   Procedure: ATRIAL FIBRILLATION ABLATION;  Surgeon: Thompson Grayer, MD;  Location: [place] [facility];  Service: Cardiovascular;  Laterality: N/A;   CARDIOVERSION N/A 08/08/2019   Procedure: CARDIOVERSION;  Surgeon: [name], MD;  Location: [place];  Service: Cardiovascular;  Laterality: N/A;   CHOLECYSTECTOMY N/A 05/16/2020   Procedure: LAPAROSCOPIC CHOLECYSTECTOMY WITH INTRAOPERATIVE CHOLANGIOGRAM AND UMBILICAL HERNIA REPAIR;  Surgeon: Greer Pickerel, MD;  [facility];  Service: General;  Laterality: N/A;   CYST EXCISION     removed back of leg   EXTRACORPOREAL SHOCK WAVE LITHOTRIPSY Left 06/08/2019   Procedure: EXTRACORPOREAL SHOCK WAVE LITHOTRIPSY (ESWL);  Surgeon: [name], MD;  Location[facility];  Service: Urology;  Laterality: Left;    Current Outpatient Medications  Medication Sig Dispense Refill   flecainide (TAMBOCOR) 100 MG tablet TAKE 1 TABLET  BY MOUTH TWICE A DAY 60 tablet 6   metoprolol succinate (TOPROL-XL) 25 MG 24 hr tablet TAKE 1/2 TABLET BY MOUTH EVERY DAY (Patient taking differently: Take 12.5 mg by mouth 2 (two) times daily.) 15 tablet 3   pantoprazole (PROTONIX) 40 MG tablet Take 1 tablet (40 mg total) by  mouth daily. 45 tablet 0   prednisoLONE acetate (PRED FORTE) 1 % ophthalmic suspension Place 1 drop into the left eye daily as needed (redness and swelling).      rivaroxaban (XARELTO) 20 MG TABS tablet Take 1 tablet (20 mg total) by mouth daily with supper. 90 tablet 3   zinc gluconate 50 MG tablet Take 50 mg by mouth 2 (two) times a week.     No current facility-administered medications for this encounter.    No Known Allergies  Social History   Socioeconomic History   Marital status: Married    Spouse name: Not on file   Number of children: Not on file   Years of education: Not on file   Highest education level: Not on file  Occupational History   Not on file  Tobacco Use   Smoking status: Never   Smokeless tobacco: Never  Vaping Use   Vaping Use: Never used  Substance and Sexual Activity   Alcohol use: Yes    Alcohol/week: 2.0 standard drinks    Types: 2 Cans of beer per week    Comment: occasional/social   Drug use: Never   Sexual activity: Yes  Other Topics Concern   Not on file  Social History Narrative   Lives in [facility]   Social Determinants of Health   Financial Resource Strain: Not on file  Food Insecurity: Not on file  Transportation Needs: Not on file  Physical Activity: Not on file  Stress: Not on file  Social Connections: Not on file  Intimate Partner Violence: Not on file     ROS- All systems are reviewed and negative except as per the HPI above.  Physical Exam: Vitals:   03/31/21 1537  BP: (!) 122/96  Pulse: (!) 102  Weight: 99.1 kg  Height: 6\' 2"  (1.88 m)    GEN- The patient is a well appearing male, alert and oriented x 3 today.   HEENT-head normocephalic, atraumatic, sclera clear, conjunctiva pink, hearing intact, trachea midline. Lungs- Clear to ausculation bilaterally, normal work of breathing Heart- irregular rate and rhythm, no murmurs, rubs or gallops  GI- soft, NT, ND, + BS Extremities- no clubbing, cyanosis, or  edema MS- no significant deformity or atrophy Skin- no rash or lesion Psych- euthymic mood, full affect Neuro- strength and sensation are intact   Wt Readings from Last 3 Encounters:  03/31/21 99.1 kg  03/18/21 95.3 kg  01/22/21 97.7 kg    EKG today demonstrates  Atrial flutter  Vent. rate 102 BPM PR interval * ms QRS duration 114 ms QT/QTcB 368/479 ms  Echo 06/08/19 demonstrated  1. Left ventricular ejection fraction, by estimation, is 60 to 65%. The  left ventricle has normal function. The left ventricle has no regional  wall motion abnormalities. LV diastolic filing cannot be assessed due to underlying atrial fibrillation.   2. Right ventricular systolic function is normal. The right ventricular  size is normal. Tricuspid regurgitation signal is inadequate for assessing PA pressure.   3. The mitral valve is normal in structure and function. No evidence of mitral valve regurgitation. No evidence of mitral stenosis.   4. The aortic  valve is tricuspid. Aortic valve regurgitation is not  visualized. Mild aortic valve sclerosis is present, with no evidence of  aortic valve stenosis.   5. The inferior vena cava is dilated in size with >50% respiratory  variability, suggesting right atrial pressure of 8 mmHg.   Epic records are reviewed at length today  CHA2DS2-VASc Score = 0 The patient's score is based upon: CHF History: No HTN History: No Age : < 65 Diabetes History: No Stroke History: No Vascular Disease History: No Gender: Male   ASSESSMENT AND PLAN: 1. Persistent Atrial Fibrillation/atrial flutter  The patient's CHA2DS2-VASc score is 0, indicating a 0.2% annual risk of stroke.   S/p afib and typical flutter ablation with Dr Rayann Heman on 01/02/20 and most recently on 03/18/21.  Will increase toprol to 25 mg bid and go ahead and schedule for cardioversion but go back to 12.5 mg daily prior to cardioversion  Continue flecainide 100 mg BID   Cardioversion scheduled for  04/18/21, bmet/cbc am of cardioversion He may still return to rhythm with extra BB Is so he will notify the office  No missed anticoagulation for at least 3 weeks, continue xarelto 20 mg daily   2. OSA Patient reports compliance with CPAP therapy. Followed by Dr Maxwell Caul.    Follow up in afib clinic after cardioversion   Butch Penny C. Toccara Alford, [facility] [address] [place], [place] [address] [phone]

## 2021-03-31 NOTE — Patient Instructions (Signed)
Increase Metoprolol to 25 mg (1 tab) Twice daily   Your physician has recommended that you have a Cardioversion (DCCV). Electrical Cardioversion uses a jolt of electricity to your heart either through paddles or wired patches attached to your chest. This is a controlled, usually prescheduled, procedure. Defibrillation is done under light anesthesia in the hospital, and you usually go home the day of the procedure. This is done to get your heart back into a normal rhythm. You are not awake for the procedure. Please see the instruction sheet given to you today.  Your physician recommends that you schedule a follow-up appointment in: 04/25/21   CARDIOVERSION INSTRUCTIONS:  You are scheduled for a Cardioversion on Friday 04/18/21 with Dr. Eden Emms.    Please arrive at the [facility] (Main Entrance A) at [facility]: [address] [place], [place] [address] at 11:00 am  DIET: Nothing to eat or drink after midnight except a sip of water with medications (see medication instructions below)  FYI: For your safety, and to allow [place] to monitor your vital signs accurately during the surgery/procedure we request that   if you have artificial nails, gel coating, SNS etc. Please have those removed prior to your surgery/procedure. Not having the nail coverings /polish removed may result in cancellation or delay of your surgery/procedure.   Medication Instructions: Thursday 04/17/21 PM ONLY TAKE METOPROLOL 12.5 MG (1/2 TAB) Friday 04/18/21 AM TAKE METOPROLOL 12.5 MG (1/2 TAB)  Continue your anticoagulant: XARELTO, PLEASE DO NOT MISS ANY DOSES OF THIS  Labs:  Come to: [facility] CL[facility] Friday 04/18/21 AT 9:30 AM FOR PRE-PROCEDURE LABS  You must have a responsible person to drive you home and stay in the waiting area during your procedure. Failure to do so could result in cancellation.  Bring your insurance cards.  *Special Note: Every effort is made to have your procedure done on time. Occasionally there are  emergencies that occur at the hospital that may cause delays. Please be patient if a delay does occur.

## 2021-03-31 NOTE — Progress Notes (Addendum)
° ° °Primary Care Physician: Meyers, Stephen, MD °Primary Cardiologist: none °Primary Electrophysiologist: Dr Allred °Referring Physician: Dr Dahal ° ° °[name] is a 55 y.o. male with a history of nephrolithiasis, OSA, and atrial fibrillation who presents for follow up in the [facility]. The patient was initially diagnosed with atrial fibrillation 06/08/19 after presenting for a ESWL for kidney stones. Afib with RVR noted incidentally and he was asymptomatic. Patient is not on anticoagulation with a CHADS2VASC score of 0. He was started on metoprolol and discharged in rate controlled afib.  He denies significant alcohol use. Patient is s/p unsuccessful DCCV on 08/08/19. Patient has started using his CPAP machine. Patient presented for DCCV on 08/30/19 and was found to be in SR after increasing flecainide. Unfortunately, he was back in rate controlled atrial flutter on follow up. ° ° Patient is s/p afib and flutter ablation with Dr Allred on 01/02/20 and again on 03/18/21. He maintained SR for about a week and then saw increases in heart rate. He called the office and Toprol 12.5 mg qd was increased to BID. He continues to  have atrial flutter today at 108 bpm.  ° °Today, he denies symptoms of chest pain, shortness of breath, orthopnea, PND, lower extremity edema, dizziness, presyncope, syncope, bleeding, or neurologic sequela. The patient is tolerating medications without difficulties and is otherwise without complaint today.  ° ° °Atrial Fibrillation Risk Factors: ° °he does have symptoms or diagnosis of sleep apnea.  °He is compliant with CPAP therapy. °he does not have a history of rheumatic fever. °he does not have a history of alcohol use. °The patient does not have a history of early familial atrial fibrillation or other arrhythmias. ° °he has a BMI of Body mass index is 28.04 kg/m².. °Filed Weights  ° 03/31/21 1537  °Weight: 99.1 kg  ° ° ° ° °Family History  °Problem Relation  Age of Onset  ° Hypertension Mother   ° ° ° °Atrial Fibrillation Management history: ° °Previous antiarrhythmic drugs: flecainide °Previous cardioversions: 08/08/19 °Previous ablations: 01/02/20 °CHADS2VASC score: 0 °Anticoagulation history: Xarelto ° ° °Past Medical History:  °Diagnosis Date  ° Dysrhythmia   ° hx a fib with ablation  ° Gallstones   ° History of kidney stones   ° Persistent atrial fibrillation (HCC)   ° Sleep apnea   ° uses CPAP  ° °Past Surgical History:  °Procedure Laterality Date  ° ATRIAL FIBRILLATION ABLATION N/A 01/02/2020  ° Procedure: ATRIAL FIBRILLATION ABLATION;  Surgeon: Allred, James, MD;  [facility];  Service: Cardiovascular;  Laterality: N/A;  ° ATRIAL FIBRILLATION ABLATION N/A 03/18/2021  ° Procedure: ATRIAL FIBRILLATION ABLATION;  Surgeon: Allred, James, MD;  [facility]: [facility];  Service: Cardiovascular;  Laterality: N/A;  ° CARDIOVERSION N/A 08/08/2019  ° Procedure: CARDIOVERSION;  Surgeon: Pleasant Hill, Tiffany, MD;  Location: [facility];  Service: Cardiovascular;  Laterality: N/A;  ° CHOLECYSTECTOMY N/A 05/16/2020  ° Procedure: LAPAROSCOPIC CHOLECYSTECTOMY WITH INTRAOPERATIVE CHOLANGIOGRAM AND UMBILICAL HERNIA REPAIR;  Surgeon: Wilson, Eric, MD;  Location: [facility];  Service: General;  Laterality: N/A;  ° CYST EXCISION    ° removed back of leg  ° EXTRACORPOREAL SHOCK WAVE LITHOTRIPSY Left 06/08/2019  ° Procedure: EXTRACORPOREAL SHOCK WAVE LITHOTRIPSY (ESWL);  Surgeon: Pace, Maryellen D, MD;  Location: [facility];  Service: Urology;  Laterality: Left;  ° ° °Current Outpatient Medications  °Medication Sig Dispense Refill  ° flecainide (TAMBOCOR) 100 MG tablet TAKE 1 TABLET   BY MOUTH TWICE A DAY 60 tablet 6   metoprolol succinate (TOPROL-XL) 25 MG 24 hr tablet TAKE 1/2 TABLET BY MOUTH EVERY DAY (Patient taking differently: Take 12.5 mg by mouth 2 (two) times daily.) 15 tablet 3   pantoprazole (PROTONIX) 40 MG tablet Take 1 tablet (40 mg total) by  mouth daily. 45 tablet 0   prednisoLONE acetate (PRED FORTE) 1 % ophthalmic suspension Place 1 drop into the left eye daily as needed (redness and swelling).      rivaroxaban (XARELTO) 20 MG TABS tablet Take 1 tablet (20 mg total) by mouth daily with supper. 90 tablet 3   zinc gluconate 50 MG tablet Take 50 mg by mouth 2 (two) times a week.     No current facility-administered medications for this encounter.    No Known Allergies  Social History   Socioeconomic History   Marital status: Married    Spouse name: Not on file   Number of children: Not on file   Years of education: Not on file   Highest education level: Not on file  Occupational History   Not on file  Tobacco Use   Smoking status: Never   Smokeless tobacco: Never  Vaping Use   Vaping Use: Never used  Substance and Sexual Activity   Alcohol use: Yes    Alcohol/week: 2.0 standard drinks    Types: 2 Cans of beer per week    Comment: occasional/social   Drug use: Never   Sexual activity: Yes  Other Topics Concern   Not on file  Social History Narrative   Lives in [facility]   Social Determinants of Health   Financial Resource Strain: Not on file  Food Insecurity: Not on file  Transportation Needs: Not on file  Physical Activity: Not on file  Stress: Not on file  Social Connections: Not on file  Intimate Partner Violence: Not on file     ROS- All systems are reviewed and negative except as per the HPI above.  Physical Exam: Vitals:   03/31/21 1537  BP: (!) 122/96  Pulse: (!) 102  Weight: 99.1 kg  Height: 6\' 2"  (1.88 m)    GEN- The patient is a well appearing male, alert and oriented x 3 today.   HEENT-head normocephalic, atraumatic, sclera clear, conjunctiva pink, hearing intact, trachea midline. Lungs- Clear to ausculation bilaterally, normal work of breathing Heart- irregular rate and rhythm, no murmurs, rubs or gallops  GI- soft, NT, ND, + BS Extremities- no clubbing, cyanosis, or  edema MS- no significant deformity or atrophy Skin- no rash or lesion Psych- euthymic mood, full affect Neuro- strength and sensation are intact   Wt Readings from Last 3 Encounters:  03/31/21 99.1 kg  03/18/21 95.3 kg  01/22/21 97.7 kg    EKG today demonstrates  Atrial flutter  Vent. rate 102 BPM PR interval * ms QRS duration 114 ms QT/QTcB 368/479 ms  Echo 06/08/19 demonstrated  1. Left ventricular ejection fraction, by estimation, is 60 to 65%. The  left ventricle has normal function. The left ventricle has no regional  wall motion abnormalities. LV diastolic filing cannot be assessed due to underlying atrial fibrillation.   2. Right ventricular systolic function is normal. The right ventricular  size is normal. Tricuspid regurgitation signal is inadequate for assessing PA pressure.   3. The mitral valve is normal in structure and function. No evidence of mitral valve regurgitation. No evidence of mitral stenosis.   4. The aortic  valve is tricuspid. Aortic valve regurgitation is not  visualized. Mild aortic valve sclerosis is present, with no evidence of  aortic valve stenosis.   5. The inferior vena cava is dilated in size with >50% respiratory  variability, suggesting right atrial pressure of 8 mmHg.   Epic records are reviewed at length today  CHA2DS2-VASc Score = 0 The patient's score is based upon: CHF History: No HTN History: No Age : < 65 Diabetes History: No Stroke History: No Vascular Disease History: No Gender: Male   ASSESSMENT AND PLAN: 1. Persistent Atrial Fibrillation/atrial flutter  The patient's CHA2DS2-VASc score is 0, indicating a 0.2% annual risk of stroke.   S/p afib and typical flutter ablation with Dr Rayann Heman on 01/02/20 and most recently on 03/18/21.  Will increase toprol to 25 mg bid and go ahead and schedule for cardioversion but go back to 12.5 mg daily prior to cardioversion  Continue flecainide 100 mg BID   Cardioversion scheduled for  04/18/21, bmet/cbc am of cardioversion He may still return to rhythm with extra BB Is so he will notify the office  No missed anticoagulation for at least 3 weeks, continue xarelto 20 mg daily   2. OSA Patient reports compliance with CPAP therapy. Followed by Dr Maxwell Caul.    Follow up in afib clinic after cardioversion   Butch Penny C. Kemet Nijjar, [facility] [address] [place], [place] [address] [phone]

## 2021-03-31 NOTE — Addendum Note (Signed)
Encounter addended by: Newman Nip, NP on: 03/31/2021 4:34 PM  Actions taken: Clinical Note Signed

## 2021-04-08 ENCOUNTER — Encounter (HOSPITAL_COMMUNITY): Payer: Self-pay | Admitting: Cardiovascular Disease

## 2021-04-08 NOTE — Progress Notes (Signed)
Attempted to obtain medical history via telephone, unable to reach at this time. I left a voicemail to return pre surgical testing department's phone call.  

## 2021-04-10 ENCOUNTER — Other Ambulatory Visit: Payer: Self-pay | Admitting: Internal Medicine

## 2021-04-10 NOTE — Telephone Encounter (Signed)
This is a A-Fib clinic pt 

## 2021-04-15 ENCOUNTER — Ambulatory Visit (HOSPITAL_COMMUNITY): Payer: 59 | Admitting: Physician Assistant

## 2021-04-18 ENCOUNTER — Other Ambulatory Visit: Payer: Self-pay

## 2021-04-18 ENCOUNTER — Ambulatory Visit (HOSPITAL_COMMUNITY)
Admission: RE | Admit: 2021-04-18 | Discharge: 2021-04-18 | Disposition: A | Discharge status: Home / Self Care | Payer: 59 | Attending: Cardiovascular Disease | Admitting: Cardiovascular Disease

## 2021-04-18 ENCOUNTER — Ambulatory Visit (HOSPITAL_COMMUNITY): Payer: 59 | Admitting: Registered Nurse

## 2021-04-18 ENCOUNTER — Ambulatory Visit (HOSPITAL_COMMUNITY)
Admission: RE | Admit: 2021-04-18 | Discharge: 2021-04-18 | Disposition: A | Discharge status: Home / Self Care | Payer: 59 | Source: Ambulatory Visit | Attending: Physician Assistant | Admitting: Physician Assistant

## 2021-04-18 ENCOUNTER — Encounter (HOSPITAL_COMMUNITY): Payer: Self-pay | Admitting: Cardiovascular Disease

## 2021-04-18 ENCOUNTER — Encounter (HOSPITAL_COMMUNITY)
Admission: RE | Disposition: A | Discharge status: Home / Self Care | Payer: Self-pay | Source: Home / Self Care | Attending: Cardiovascular Disease

## 2021-04-18 DIAGNOSIS — I4819 Other persistent atrial fibrillation: Secondary | ICD-10-CM | POA: Diagnosis not present

## 2021-04-18 DIAGNOSIS — G4733 Obstructive sleep apnea (adult) (pediatric): Secondary | ICD-10-CM | POA: Insufficient documentation

## 2021-04-18 DIAGNOSIS — Z79899 Other long term (current) drug therapy: Secondary | ICD-10-CM | POA: Insufficient documentation

## 2021-04-18 DIAGNOSIS — I4892 Unspecified atrial flutter: Secondary | ICD-10-CM | POA: Insufficient documentation

## 2021-04-18 DIAGNOSIS — I480 Paroxysmal atrial fibrillation: Secondary | ICD-10-CM

## 2021-04-18 DIAGNOSIS — I4891 Unspecified atrial fibrillation: Secondary | ICD-10-CM | POA: Diagnosis not present

## 2021-04-18 HISTORY — PX: CARDIOVERSION: SHX1299

## 2021-04-18 LAB — CBC
HCT: 46.8 % (ref 39.0–52.0)
Hemoglobin: 15.5 g/dL (ref 13.0–17.0)
MCH: 29.2 pg (ref 26.0–34.0)
MCHC: 33.1 g/dL (ref 30.0–36.0)
MCV: 88.1 fL (ref 80.0–100.0)
Platelets: 247 10*3/uL (ref 150–400)
RBC: 5.31 MIL/uL (ref 4.22–5.81)
RDW: 12.1 % (ref 11.5–15.5)
WBC: 7.3 10*3/uL (ref 4.0–10.5)
nRBC: 0 % (ref 0.0–0.2)

## 2021-04-18 LAB — BASIC METABOLIC PANEL
Anion gap: 9 (ref 5–15)
BUN: 16 mg/dL (ref 6–20)
CO2: 28 mmol/L (ref 22–32)
Calcium: 9.4 mg/dL (ref 8.9–10.3)
Chloride: 102 mmol/L (ref 98–111)
Creatinine, Ser: 1.19 mg/dL (ref 0.61–1.24)
GFR, Estimated: >60 mL/min (ref >60)
Glucose, Bld: 109 mg/dL — ABNORMAL HIGH (ref 70–99)
Potassium: 4.1 mmol/L (ref 3.5–5.1)
Sodium: 139 mmol/L (ref 135–145)

## 2021-04-18 SURGERY — CARDIOVERSION
Anesthesia: General

## 2021-04-18 MED ORDER — PROPOFOL 10 MG/ML IV BOLUS
INTRAVENOUS | Status: DC | PRN
  Administered 2021-04-18: 100 mg via INTRAVENOUS

## 2021-04-18 MED ORDER — LIDOCAINE 2% (20 MG/ML) 5 ML SYRINGE
INTRAMUSCULAR | Status: DC | PRN
  Administered 2021-04-18: 100 mg via INTRAVENOUS

## 2021-04-18 MED ORDER — SODIUM CHLORIDE 0.9 % IV SOLN: INTRAVENOUS | Status: DC

## 2021-04-18 NOTE — Anesthesia Procedure Notes (Signed)
Date/Time: 04/18/2021 12:02 PM Performed by: Laruth Bouchard., CRNA Pre-anesthesia Checklist: Patient identified, Emergency Drugs available, Suction available, Patient being monitored and Timeout performed Patient Re-evaluated:Patient Re-evaluated prior to induction Oxygen Delivery Method: Ambu bag Preoxygenation: Pre-oxygenation with 100% oxygen Induction Type: IV induction Placement Confirmation: positive ETCO2

## 2021-04-18 NOTE — Discharge Instructions (Signed)
TEE ° °YOU HAD AN CARDIAC PROCEDURE TODAY: Refer to the procedure report and other information in the discharge instructions given to you for any specific questions about what was found during the examination. If this information does not answer your questions, please call [facility] office at [phone] to clarify.  ° °DIET: Your first meal following the procedure should be a light meal and then it is ok to progress to your normal diet. A half-sandwich or bowl of soup is an example of a good first meal. Heavy or fried foods are harder to digest and may make you feel nauseous or bloated. Drink plenty of fluids but you should avoid alcoholic beverages for 24 hours. If you had a esophageal dilation, please see attached instructions for diet.  ° °ACTIVITY: Your care partner should take you home directly after the procedure. You should plan to take it easy, moving slowly for the rest of the day. You can resume normal activity the day after the procedure however YOU SHOULD NOT DRIVE, use power tools, machinery or perform tasks that involve climbing or major physical exertion for 24 hours (because of the sedation medicines used during the test).  ° °SYMPTOMS TO REPORT IMMEDIATELY: °A cardiologist can be reached at any hour.   °Vomiting of blood or coffee ground material  °New, significant abdominal pain  °New, significant chest pain or pain under the shoulder blades  °Painful or persistently difficult swallowing  °New shortness of breath  °Black, tarry-looking or red, bloody stools ° °FOLLOW UP:  °Please also call with any specific questions about appointments or follow up tests. °  °

## 2021-04-18 NOTE — Transfer of Care (Signed)
Immediate Anesthesia Transfer of Care Note  Patient: [name]  Procedure(s) Performed: CARDIOVERSION  Patient Location: PACU and Endoscopy Unit  Anesthesia Type:General  Level of Consciousness: awake and drowsy  Airway & Oxygen Therapy: Patient Spontanous Breathing  Post-op Assessment: Report given to RN and Post -op Vital signs reviewed and stable  Post vital signs: Reviewed and stable  Last Vitals:  Vitals Value Taken Time  BP    Temp    Pulse    Resp    SpO2      Last Pain:  Vitals:   04/18/21 1116  TempSrc: Oral  PainSc: 0-No pain         Complications: No notable events documented.

## 2021-04-18 NOTE — Anesthesia Preprocedure Evaluation (Signed)
Anesthesia Evaluation  Patient identified by MRN, date of birth, ID band Patient awake    Reviewed: Allergy & Precautions, H&P , NPO status , Patient's Chart, lab work & pertinent test results  History of Anesthesia Complications Negative for: history of anesthetic complications  Airway Mallampati: II  TM Distance: <3 FB Neck ROM: full    Dental   Pulmonary sleep apnea and Continuous Positive Airway Pressure Ventilation ,    Pulmonary exam normal        Cardiovascular + dysrhythmias Atrial Fibrillation  Rhythm:irregular Rate:Normal     Neuro/Psych negative neurological ROS  negative psych ROS   GI/Hepatic negative GI ROS, Neg liver ROS,   Endo/Other  negative endocrine ROS  Renal/GU negative Renal ROS  negative genitourinary   Musculoskeletal   Abdominal   Peds  Hematology   Anesthesia Other Findings Xarelto  Reproductive/Obstetrics                             Anesthesia Physical Anesthesia Plan  ASA: 3  Anesthesia Plan: General   Post-op Pain Management:    Induction:   PONV Risk Score and Plan:   Airway Management Planned:   Additional Equipment:   Intra-op Plan:   Post-operative Plan:   Informed Consent: I have reviewed the patients History and Physical, chart, labs and discussed the procedure including the risks, benefits and alternatives for the proposed anesthesia with the patient or authorized representative who has indicated his/her understanding and acceptance.       Plan Discussed with: Anesthesiologist  Anesthesia Plan Comments:         Anesthesia Quick Evaluation

## 2021-04-18 NOTE — CV Procedure (Signed)
DCC: Rx DOAC no missed doses Anesthesia: propofol  DCC x 1 150 J biphasic Converted from afib rate 100 to NSR with PACls rate 68 bpm  No immediate neurologic sequelae  Charlton Haws MD [facility]

## 2021-04-18 NOTE — Anesthesia Postprocedure Evaluation (Signed)
Anesthesia Post Note  Patient: [name]  Procedure(s) Performed: CARDIOVERSION     Patient location during evaluation: Endoscopy Anesthesia Type: General Level of consciousness: awake and alert Pain management: pain level controlled Vital Signs Assessment: post-procedure vital signs reviewed and stable Respiratory status: spontaneous breathing, nonlabored ventilation and respiratory function stable Cardiovascular status: blood pressure returned to baseline and stable Postop Assessment: no apparent nausea or vomiting Anesthetic complications: no   No notable events documented.  Last Vitals:  Vitals:   04/18/21 1220 04/18/21 1227  BP: 138/83 124/76  Pulse: 70 67  Resp: 13 10  Temp:    SpO2: 100% 99%    Last Pain:  Vitals:   04/18/21 1213  TempSrc: Oral  PainSc:                  Lucretia Kern

## 2021-04-18 NOTE — Interval H&P Note (Signed)
History and Physical Interval Note:  04/18/2021 11:07 AM  [name]  has presented today for surgery, with the diagnosis of afib.  The various methods of treatment have been discussed with the patient and family. After consideration of risks, benefits and other options for treatment, the patient has consented to  Procedure(s): CARDIOVERSION (N/A) as a surgical intervention.  The patient's history has been reviewed, patient examined, no change in status, stable for surgery.  I have reviewed the patient's chart and labs.  Questions were answered to the patient's satisfaction.     [name]

## 2021-04-21 ENCOUNTER — Encounter (HOSPITAL_COMMUNITY): Payer: Self-pay | Admitting: Cardiovascular Disease

## 2021-04-25 ENCOUNTER — Ambulatory Visit (HOSPITAL_COMMUNITY)
Admission: RE | Admit: 2021-04-25 | Discharge: 2021-04-25 | Disposition: A | Discharge status: Home / Self Care | Payer: 59 | Source: Ambulatory Visit | Attending: Physician Assistant | Admitting: Physician Assistant

## 2021-04-25 ENCOUNTER — Other Ambulatory Visit: Payer: Self-pay

## 2021-04-25 VITALS — BP 118/82 | HR 71 | Ht 74.0 in | Wt 214.0 lb

## 2021-04-25 DIAGNOSIS — Z7901 Long term (current) use of anticoagulants: Secondary | ICD-10-CM | POA: Diagnosis not present

## 2021-04-25 DIAGNOSIS — Z9989 Dependence on other enabling machines and devices: Secondary | ICD-10-CM | POA: Diagnosis not present

## 2021-04-25 DIAGNOSIS — Z87442 Personal history of urinary calculi: Secondary | ICD-10-CM | POA: Insufficient documentation

## 2021-04-25 DIAGNOSIS — I4892 Unspecified atrial flutter: Secondary | ICD-10-CM | POA: Diagnosis not present

## 2021-04-25 DIAGNOSIS — Z79899 Other long term (current) drug therapy: Secondary | ICD-10-CM | POA: Insufficient documentation

## 2021-04-25 DIAGNOSIS — I4819 Other persistent atrial fibrillation: Secondary | ICD-10-CM | POA: Insufficient documentation

## 2021-04-25 DIAGNOSIS — G4733 Obstructive sleep apnea (adult) (pediatric): Secondary | ICD-10-CM | POA: Insufficient documentation

## 2021-04-25 MED ORDER — METOPROLOL SUCCINATE ER 25 MG PO TB24: 25.0000 mg | ORAL_TABLET | Freq: Two times a day (BID) | ORAL | 3 refills | Status: DC

## 2021-04-25 NOTE — Progress Notes (Signed)
Primary Care Physician: Joycelyn Rua, MD Primary Cardiologist: none Primary Electrophysiologist: Dr Johney Frame Referring Physician: Dr Boris Sharper is a 56 y.o. male with a history of nephrolithiasis, OSA, and atrial fibrillation who presents for follow up in the [facility]. The patient was initially diagnosed with atrial fibrillation 06/08/19 after presenting for a ESWL for kidney stones. Afib with RVR noted incidentally and he was asymptomatic. Patient is not on anticoagulation with a CHADS2VASC score of 0. He was started on metoprolol and discharged in rate controlled afib.  He denies significant alcohol use. Patient is s/p unsuccessful DCCV on 08/08/19. Patient has started using his CPAP machine. Patient presented for DCCV on 08/30/19 and was found to be in SR after increasing flecainide. Unfortunately, he was back in rate controlled atrial flutter on follow up. Patient is s/p afib and flutter ablation with Dr Johney Frame on 01/02/20 and again on 03/18/21. He maintained SR for about a week and then saw increases in heart rate.   On follow up today, patient is s/p DCCV on 04/18/21. He is in SR today however, he does still have frequent intermittent episodes of elevated heart rates on his smart watch. There are no specific triggers that he can identify. He reports good compliance with CPAP therapy.   Today, he denies symptoms of chest pain, shortness of breath, orthopnea, PND, lower extremity edema, dizziness, presyncope, syncope, bleeding, or neurologic sequela. The patient is tolerating medications without difficulties and is otherwise without complaint today.    Atrial Fibrillation Risk Factors:  he does have symptoms or diagnosis of sleep apnea.  He is compliant with CPAP therapy. he does not have a history of rheumatic fever. he does not have a history of alcohol use. The patient does not have a history of early familial atrial fibrillation or other  arrhythmias.  he has a BMI of Body mass index is 27.48 kg/m.Marland Kitchen Filed Weights   04/25/21 0901  Weight: 97.1 kg       Family History  Problem Relation Age of Onset   Hypertension Mother      Atrial Fibrillation Management history:  Previous antiarrhythmic drugs: flecainide Previous cardioversions: 08/08/19, 04/18/21 Previous ablations: 01/02/20, 03/18/21 CHADS2VASC score: 0 Anticoagulation history: Xarelto   Past Medical History:  Diagnosis Date   Dysrhythmia    hx a fib with ablation   Gallstones    History of kidney stones    Persistent atrial fibrillation (HCC)    Sleep apnea    uses CPAP   Past Surgical History:  Procedure Laterality Date   ATRIAL FIBRILLATION ABLATION N/A 01/02/2020   Procedure: ATRIAL FIBRILLATION ABLATION;  Surgeon: Hillis Range, MD;  [facility];  Service: Cardiovascular;  Laterality: N/A;   ATRIAL FIBRILLATION ABLATION N/A 03/18/2021   Procedure: ATRIAL FIBRILLATION ABLATION;  Surgeon: Hillis Range, MD;  Location: [facility];  Service: Cardiovascular;  Laterality: N/A;   CARDIOVERSION N/A 08/08/2019   Procedure: CARDIOVERSION;  Surgeon: Chilton Si, MD;  Location: [facility];  Service: Cardiovascular;  Laterality: N/A;   CARDIOVERSION N/A 04/18/2021   Procedure: CARDIOVERSION;  Surgeon: Wendall Stade, MD;  Location[facility]OSCOPY;  Service: Cardiovascular;  Laterality: N/A;   CHOLECYSTECTOMY N/A 05/16/2020   Procedure: LAPAROSCOPIC CHOLECYSTECTOMY WITH INTRAOPERATIVE CHOLANGIOGRAM AND UMBILICAL HERNIA REPAIR;  Surgeon: Gaynelle Adu, MD;  Location: [facility];  Service: General;  Laterality: N/A;   CYST EXCISION     removed back of leg   EXTRACORPOREAL SHOCK  WAVE LITHOTRIPSY Left 06/08/2019   Procedure: EXTRACORPOREAL SHOCK WAVE LITHOTRIPSY (ESWL);  Surgeon: Noel Christmas, MD;  Location: [facility];  Service: Urology;  Laterality: Left;    Current Outpatient Medications  Medication Sig Dispense Refill    flecainide (TAMBOCOR) 100 MG tablet TAKE 1 TABLET BY MOUTH TWICE A DAY 60 tablet 6   prednisoLONE acetate (PRED FORTE) 1 % ophthalmic suspension Place 1 drop into the left eye daily as needed (redness and swelling).      rivaroxaban (XARELTO) 20 MG TABS tablet Take 1 tablet (20 mg total) by mouth daily with supper. 90 tablet 3   zinc gluconate 50 MG tablet Take 50 mg by mouth 2 (two) times a week.     metoprolol succinate (TOPROL-XL) 25 MG 24 hr tablet Take 1 tablet (25 mg total) by mouth 2 (two) times daily. 60 tablet 3   No current facility-administered medications for this encounter.    No Known Allergies  Social History   Socioeconomic History   Marital status: Married    Spouse name: Not on file   Number of children: Not on file   Years of education: Not on file   Highest education level: Not on file  Occupational History   Not on file  Tobacco Use   Smoking status: Never   Smokeless tobacco: Never  Vaping Use   Vaping Use: Never used  Substance and Sexual Activity   Alcohol use: Yes    Alcohol/week: 2.0 standard drinks    Types: 2 Cans of beer per week    Comment: occasional/social   Drug use: Never   Sexual activity: Yes  Other Topics Concern   Not on file  Social History Narrative   Lives in [facility]   Social Determinants of Health   Financial Resource Strain: Not on file  Food Insecurity: Not on file  Transportation Needs: Not on file  Physical Activity: Not on file  Stress: Not on file  Social Connections: Not on file  Intimate Partner Violence: Not on file     ROS- All systems are reviewed and negative except as per the HPI above.  Physical Exam: Vitals:   04/25/21 0901  BP: 118/82  Pulse: 71  Weight: 97.1 kg  Height:  (1.88 m)    GEN- The patient is a well appearing male, alert and oriented x 3 today.   HEENT-head normocephalic, atraumatic, sclera clear, conjunctiva pink, hearing intact, trachea midline. Lungs- Clear to  ausculation bilaterally, normal work of breathing Heart- Regular rate and rhythm, no murmurs, rubs or gallops  GI- soft, NT, ND, + BS Extremities- no clubbing, cyanosis, or edema MS- no significant deformity or atrophy Skin- no rash or lesion Psych- euthymic mood, full affect Neuro- strength and sensation are intact   Wt Readings from Last 3 Encounters:  04/25/21 97.1 kg  04/18/21 99.1 kg  03/31/21 99.1 kg    EKG today demonstrates  SR Vent. rate 71 BPM PR interval 172 ms QRS duration 104 ms QT/QTcB 422/458 ms  Echo 06/08/19 demonstrated  1. Left ventricular ejection fraction, by estimation, is 60 to 65%. The  left ventricle has normal function. The left ventricle has no regional  wall motion abnormalities. LV diastolic filing cannot be assessed due to underlying atrial fibrillation.   2. Right ventricular systolic function is normal. The right ventricular  size is normal. Tricuspid regurgitation signal is inadequate for assessing PA pressure.   3. The mitral valve is  normal in structure and function. No evidence of mitral valve regurgitation. No evidence of mitral stenosis.   4. The aortic valve is tricuspid. Aortic valve regurgitation is not  visualized. Mild aortic valve sclerosis is present, with no evidence of  aortic valve stenosis.   5. The inferior vena cava is dilated in size with >50% respiratory  variability, suggesting right atrial pressure of 8 mmHg.   Epic records are reviewed at length today  CHA2DS2-VASc Score = 0 The patient's score is based upon: CHF History: No HTN History: No Age : < 65 Diabetes History: No Stroke History: No Vascular Disease History: No Gender: Male   ASSESSMENT AND PLAN: 1. Persistent Atrial Fibrillation/atrial flutter  The patient's CHA2DS2-VASc score is 0, indicating a 0.2% annual risk of stroke.   S/p afib and typical flutter ablation with Dr Rayann Heman on 01/02/20 and most recently on 03/18/21.  S/p DCCV on 04/18/21 Patient  having frequent episodes of elevated heart rates.  Will increase Toprol to 25 mg BID as he has tolerated this dose in the past. Patient to check BP and heart rate at home.  If he continues to have frequent episodes over the next two months, may need to consider a change in AAD. We briefly discussed dofetilide today, information sheet given.  Continue flecainide 100 mg BID   Continue Xarelto 20 mg daily with no missed doses for 3 months post ablation.   2. OSA Patient reports compliance with CPAP therapy. Followed by Dr Maxwell Caul.    Follow up with Dr Rayann Heman at 3 months post ablation. Sooner in AF clinic if needed.    [facility] [address] [place], [place] [address] [phone]

## 2021-04-25 NOTE — Patient Instructions (Signed)
Increase metoprolol to 25mg twice a day

## 2021-04-29 ENCOUNTER — Other Ambulatory Visit: Payer: Self-pay | Admitting: Internal Medicine

## 2021-06-06 ENCOUNTER — Other Ambulatory Visit (HOSPITAL_COMMUNITY): Payer: Self-pay | Admitting: *Deleted

## 2021-06-06 MED ORDER — METOPROLOL SUCCINATE ER 25 MG PO TB24
25.0000 mg | ORAL_TABLET | Freq: Two times a day (BID) | ORAL | 6 refills | Status: DC
Start: 2021-06-06 — End: 2022-03-09

## 2021-06-12 IMAGING — CT CT ANGIO CHEST-ABD-PELV FOR DISSECTION W/ AND WO/W CM
2 of 8 series · 11 of 46 positions shown, 12 images · non-contrast
Comparison: Cardiac CT December 27, 2019

CLINICAL DATA: Chest and abdominal pain

EXAM:
CT ANGIOGRAPHY CHEST, ABDOMEN AND PELVIS
TECHNIQUE: Non-contrast CT of the chest was initially obtained.

[Series 7: arterial · axial · arterial · 0.71mm/px · z∈[+788,+1352]mm · 8 of 364 slices shown, 9 images]
[im 41/364  soft-tissue]
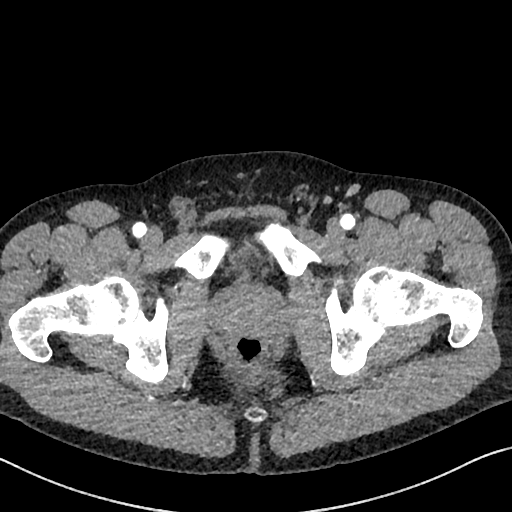
[im 41/364  bone]
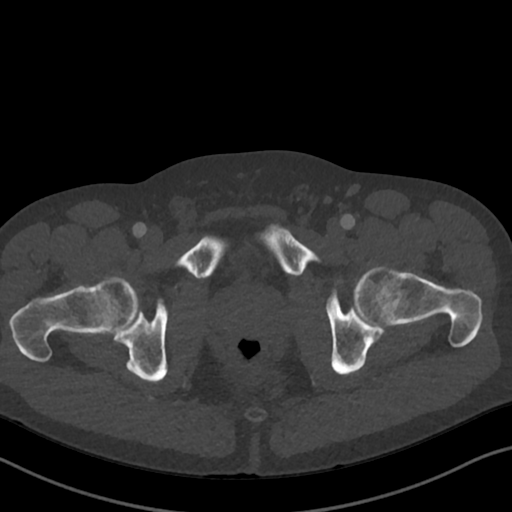
[im 81/364  soft-tissue]
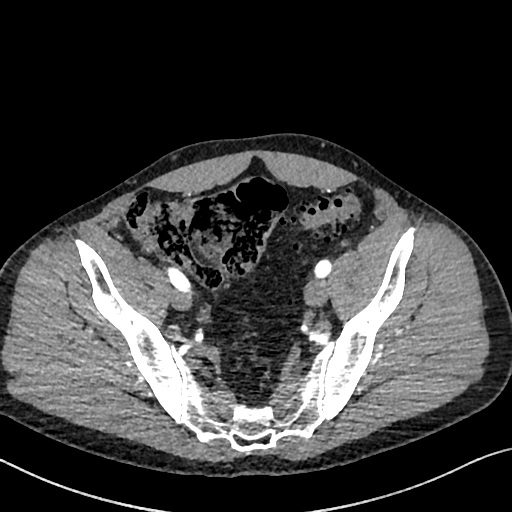
[im 122/364  soft-tissue]
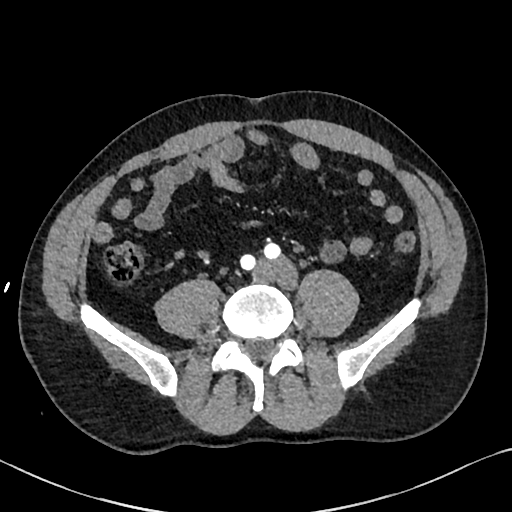
[im 162/364  soft-tissue]
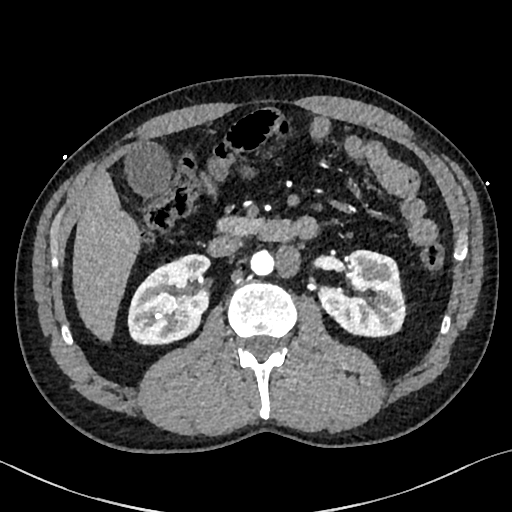
[im 202/364  soft-tissue]
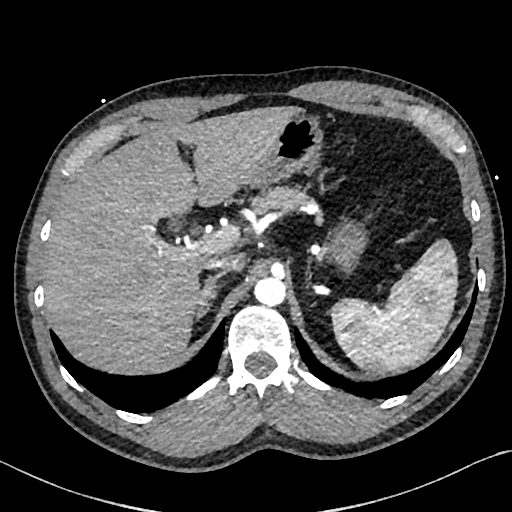
[im 243/364  soft-tissue]
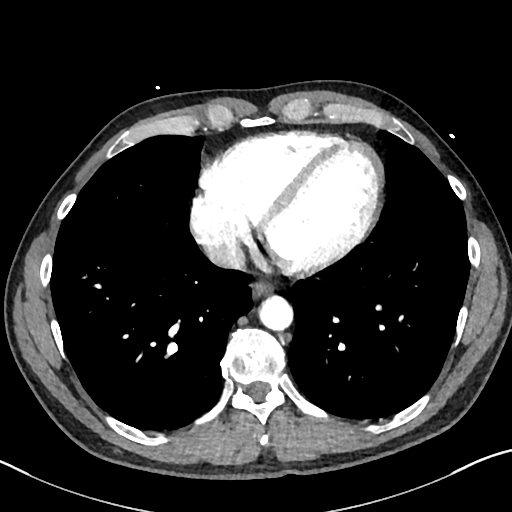
[im 283/364  soft-tissue]
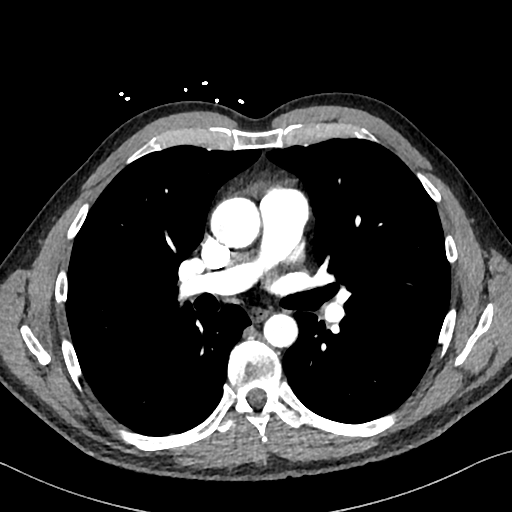
[im 323/364  soft-tissue]
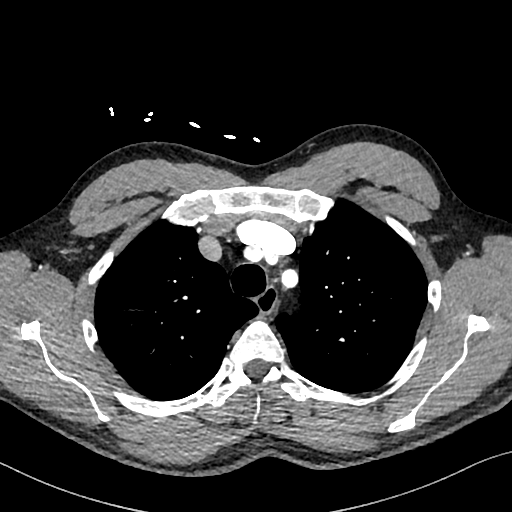

[Series 10: cor · coronal · 0.73mm/px · 3 of 151 slices shown]
[im 38/151  soft-tissue]
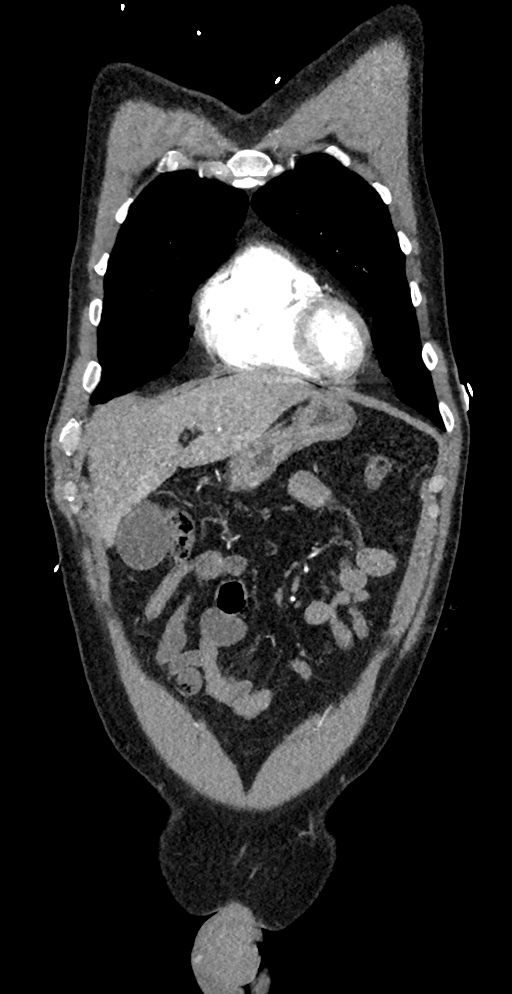
[im 76/151  soft-tissue]
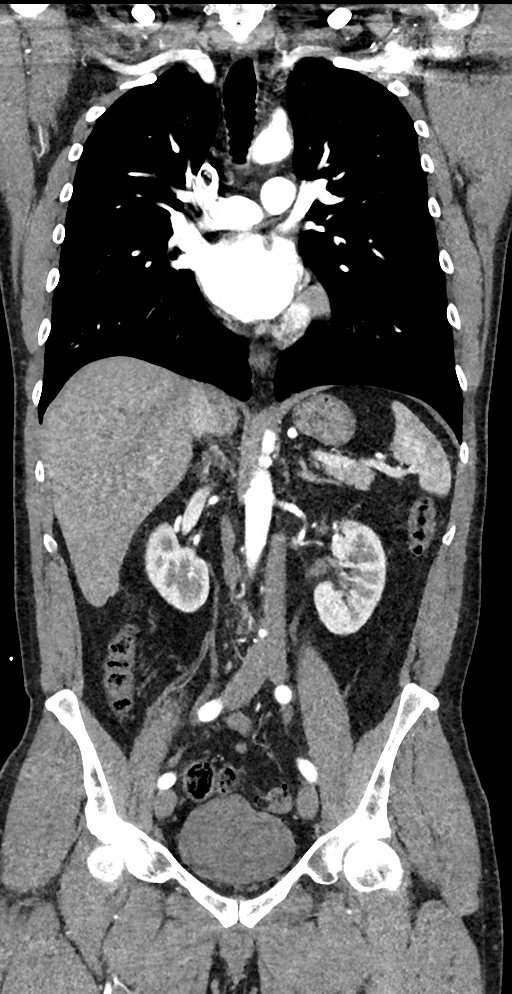
[im 113/151  soft-tissue]
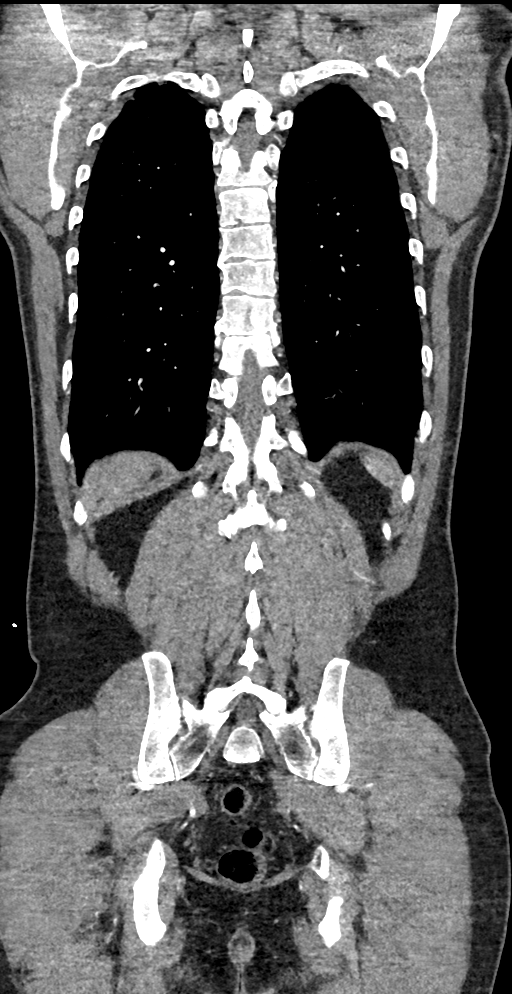

[11 of 46 positions shown; findings below may reference images not displayed]

Multidetector CT imaging through the chest, abdomen and pelvis was
performed using the standard protocol during bolus administration of
intravenous contrast. Multiplanar reconstructed images and MIPs were
obtained and reviewed to evaluate the vascular anatomy.

CONTRAST:  100mL OMNIPAQUE IOHEXOL 350 MG/ML SOLN
FINDINGS: CTA CHEST FINDINGS

Cardiovascular: On noncontrast enhanced chest CT examination, there
is no intramural hematoma evident. There is no appreciable thoracic
aortic aneurysm or dissection. The visualized great vessels appear
unremarkable. No appreciable atherosclerotic plaque noted in the
visualized great vessels. No appreciable atherosclerotic
irregularity noted in the thoracic aorta. There is no appreciable
pericardial effusion or pericardial thickening. There is no
demonstrable pulmonary embolus.

Mediastinum/Nodes: Thyroid appears unremarkable. There is no
appreciable thoracic adenopathy. No esophageal lesions are evident.

Lungs/Pleura: There is minimal apical pleural thickening
bilaterally. There is no pneumothorax. There is no appreciable edema
or airspace opacity. There is slight atelectasis in the lung bases.
No pleural effusions are evident. On axial slice 93 series 8, there
is a 5 x 2 mm nodular opacity at the level of the right minor
fissure which may represent an intra fissural lymph node. No other
nodular opacities are evident.

Musculoskeletal: No fracture or dislocation. No blastic or lytic
bone lesions. No evident chest wall lesions.

Review of the MIP images confirms the above findings.

CTA ABDOMEN AND PELVIS FINDINGS

VASCULAR

Aorta: There is no abdominal aortic aneurysm or dissection. No
appreciable atherosclerotic plaque noted in the aorta.

Celiac: The celiac artery and its branches are widely patent. No
aneurysm or dissection involving these vessels.

SMA: The superior mesenteric artery and its branches are patent. No
aneurysm or dissection involving these vessels.

Renals: There is a main renal artery on each side with a smaller
accessory renal artery arising on each side inferior to the main
renal artery. The renal arteries and their respective branches are
patent throughout their respective courses. No aneurysm or
dissection involving these vessels. No evident fibromuscular
dysplasia.

IMA: The inferior mesenteric artery and its branches are patent. No
aneurysm or dissection involving these vessels evident.

Inflow: Pelvic arterial vessels are widely patent without
appreciable atherosclerotic plaque. No aneurysm or dissection
involving these vessels. The proximal superficial femoral and
profunda femoral arteries are also patent without aneurysm or
dissection involving these vessels.

Veins: No obvious venous abnormality within the limitations of this
arterial phase study.

Review of the MIP images confirms the above findings.

NON-VASCULAR

Hepatobiliary: No focal liver lesions are appreciable. The
gallbladder wall is not appreciably thickened. There is no biliary
duct dilatation.

Pancreas: There is no pancreatic mass or inflammatory focus.

Spleen: No splenic lesions are evident.

Adrenals/Urinary Tract: Adrenals bilaterally appear normal. Kidneys
bilaterally show no evident mass or hydronephrosis on either side.
There are scattered 2-3 mm calculi in the right kidney. No calculi
seen in the left kidney. No ureteral calculi on either side. Urinary
bladder is midline with wall thickness within normal limits.

Stomach/Bowel: There is extensive sigmoid diverticulosis without
evident diverticulitis. There also descending colonic diverticula
without diverticulitis. There is no bowel wall thickening or bowel
obstruction. Terminal ileum appears normal. No bowel obstruction. No
free air or portal venous air.

Lymphatic: No demonstrable adenopathy in the abdomen or pelvis.

Reproductive: Prostate and seminal vesicles are normal in size and
contour.

Other: The appendix appears normal. No evident abscess or ascites in
the abdomen or pelvis.

Musculoskeletal: No fracture or dislocation evident. No blastic or
lytic bone lesions appreciable. There is no intramuscular or
abdominal wall lesion.

Review of the MIP images confirms the above findings.
IMPRESSION: CT angiogram chest:

1. No thoracic aortic aneurysm or dissection. No appreciable
atherosclerotic plaque in thoracic arterial vessels.
2. No evident pulmonary embolus.
3. No edema or airspace opacity. No pleural effusions. 5 x 2 mm
nodular opacity which may represent an intra fissural lymph node in
the right middle lobe region. Other nodular opacities evident. No
follow-up needed if patient is low-risk. Non-contrast chest CT can
be considered in 12 months if patient is high-risk. This
recommendation follows the consensus statement: Guidelines for
Management of Incidental Pulmonary Nodules Detected on CT Images:

CT angiogram abdomen; CT angiogram pelvis:

1. No aneurysm or dissection involving the aorta, major mesenteric,
and major arterial vascular structures. No appreciable
atherosclerotic plaque evident. No fibromuscular dysplasia.

2. Left-sided colonic diverticulosis without diverticulitis. No
bowel wall thickening or bowel obstruction. No abscess in the
abdomen or pelvis. Appendix appears normal.

3. There are several 2-3 mm nonobstructing calculi in the right
kidney. No hydronephrosis or ureteral calculus on either side.
Urinary bladder wall thickness normal.

## 2021-07-03 ENCOUNTER — Other Ambulatory Visit: Payer: Self-pay

## 2021-07-03 ENCOUNTER — Ambulatory Visit (INDEPENDENT_AMBULATORY_CARE_PROVIDER_SITE_OTHER): Payer: 59 | Admitting: Internal Medicine

## 2021-07-03 ENCOUNTER — Encounter: Payer: Self-pay | Admitting: Internal Medicine

## 2021-07-03 VITALS — BP 120/82 | HR 57 | Ht 74.0 in | Wt 217.2 lb

## 2021-07-03 DIAGNOSIS — I4819 Other persistent atrial fibrillation: Secondary | ICD-10-CM

## 2021-07-03 NOTE — Patient Instructions (Addendum)
Medication Instructions:  ?Your physician has recommended you make the following change in your medication:  ? ? STOP Xarelto ? ? ?Lab Work: ?None ordered. ?If you have labs (blood work) drawn today and your tests are completely normal, you will receive your results only by: ?MyChart Message (if you have MyChart) OR ?A paper copy in the mail ?If you have any lab test that is abnormal or we need to change your treatment, we will call you to review the results. ? ?Testing/Procedures: ?None ordered. ? ?Follow-Up: ?At [facility], you and your health needs are our priority.  As part of our continuing mission to provide you with exceptional heart care, we have created designated Provider Care Teams.  These Care Teams include your primary Cardiologist (physician) and Advanced Practice Providers (APPs -  Physician Assistants and Nurse Practitioners) who all work together to provide you with the care you need, when you need it. ? ?Your next appointment:   ?Your physician wants you to follow-up in: 3 months with [name]  ? ?

## 2021-07-03 NOTE — Progress Notes (Signed)
? ?PCP: Joycelyn Rua, MD ?  ? ?[name] is a 56 y.o. male who presents today for routine electrophysiology followup.  Since his recent afib ablation, the patient reports doing very well.  he denies procedure related complications and is pleased with the results of the procedure.  Today, he denies symptoms of palpitations, chest pain, shortness of breath,  lower extremity edema, dizziness, presyncope, or syncope.  The patient is otherwise without complaint today.  ? ?Past Medical History:  ?Diagnosis Date  ? Dysrhythmia   ? hx a fib with ablation  ? Gallstones   ? History of kidney stones   ? Persistent atrial fibrillation (HCC)   ? Sleep apnea   ? uses CPAP  ? ?Past Surgical History:  ?Procedure Laterality Date  ? ATRIAL FIBRILLATION ABLATION N/A 01/02/2020  ? Procedure: ATRIAL FIBRILLATION ABLATION;  Surgeon: Hillis Range, MD;  Location: [facility];  Service: Cardiovascular;  Laterality: N/A;  ? ATRIAL FIBRILLATION ABLATION N/A 03/18/2021  ? Procedure: ATRIAL FIBRILLATION ABLATION;  Surgeon: Hillis Range, MD;  Location: [facility];  Service: Cardiovascular;  Laterality: N/A;  ? CARDIOVERSION N/A 08/08/2019  ? Procedure: CARDIOVERSION;  Surgeon: Chilton Si, MD;  Location: [facility] ENDOSCOPY;  Service: Cardiovascular;  Laterality: N/A;  ? CARDIOVERSION N/A 04/18/2021  ? Procedure: CARDIOVERSION;  Surgeon: Wendall Stade, MD;  Location: [facility] ENDOSCOPY;  Service: Cardiovascular;  Laterality: N/A;  ? CHOLECYSTECTOMY N/A 05/16/2020  ? Procedure: LAPAROSCOPIC CHOLECYSTECTOMY WITH INTRAOPERATIVE CHOLANGIOGRAM AND UMBILICAL HERNIA REPAIR;  Surgeon: Gaynelle Adu, MD;  Location: [facility] ORS;  Service: General;  Laterality: N/A;  ? CYST EXCISION    ? removed back of leg  ? EXTRACORPOREAL SHOCK WAVE LITHOTRIPSY Left 06/08/2019  ? Procedure: EXTRACORPOREAL SHOCK WAVE LITHOTRIPSY (ESWL);  Surgeon: Noel Christmas, MD;  Location: [facility];  Service: Urology;  Laterality: Left;  ? ? ?ROS- all  systems are personally reviewed and negatives except as per HPI above ? ?Current Outpatient Medications  ?Medication Sig Dispense Refill  ? flecainide (TAMBOCOR) 100 MG tablet TAKE 1 TABLET BY MOUTH TWICE A DAY 60 tablet 6  ? metoprolol succinate (TOPROL-XL) 25 MG 24 hr tablet Take 1 tablet (25 mg total) by mouth 2 (two) times daily. 60 tablet 6  ? prednisoLONE acetate (PRED FORTE) 1 % ophthalmic suspension Place 1 drop into the left eye daily as needed (redness and swelling).     ? rivaroxaban (XARELTO) 20 MG TABS tablet Take 1 tablet (20 mg total) by mouth daily with supper. 90 tablet 3  ? zinc gluconate 50 MG tablet Take 50 mg by mouth 2 (two) times a week.    ? ?No current facility-administered medications for this visit.  ? ? ?Physical Exam: ?Vitals:  ? 07/03/21 1613  ?BP: 120/82  ?Pulse: (!) 57  ?SpO2: 92%  ?Weight: 217 lb 3.2 oz (98.5 kg)  ?Height: 6\' 2"  (1.88 m)  ? ? ?GEN- The patient is well appearing, alert and oriented x 3 today.   ?Head- normocephalic, atraumatic ?Eyes-  Sclera clear, conjunctiva pink ?Ears- hearing intact ?Oropharynx- clear ?Lungs- Clear to ausculation bilaterally, normal work of breathing ?Heart- Regular rate and rhythm, no murmurs, rubs or gallops, PMI not laterally displaced ?GI- soft, NT, ND, + BS ?Extremities- no clubbing, cyanosis, or edema ? ?EKG tracing ordered today is personally reviewed and shows sinus ? ?Assessment and Plan: ? ?1. Persistent atrial fibrillation/ atypical atrial flutter ?Doing well s/p ablation ?chads2vasc score is 0 ?Stop xarelto  ?He  is reluctant to stop flecainide.  He will consider reducing flecainide to 50mg  BID prior to his next visit. ? ?2. OSA ?Compliant with CPAP ? ? ?Return to see me in 3 months ? ? MD, FACC ?07/03/2021 ?4:14 PM ? ? ? ? ?

## 2021-09-07 ENCOUNTER — Other Ambulatory Visit (HOSPITAL_COMMUNITY): Payer: Self-pay | Admitting: Physician Assistant

## 2021-10-22 ENCOUNTER — Encounter: Payer: Self-pay | Admitting: Internal Medicine

## 2021-10-22 ENCOUNTER — Ambulatory Visit (INDEPENDENT_AMBULATORY_CARE_PROVIDER_SITE_OTHER): Payer: 59 | Admitting: Internal Medicine

## 2021-10-22 VITALS — BP 124/78 | HR 68 | Ht 74.0 in | Wt 220.8 lb

## 2021-10-22 DIAGNOSIS — I4819 Other persistent atrial fibrillation: Secondary | ICD-10-CM

## 2021-10-22 DIAGNOSIS — I484 Atypical atrial flutter: Secondary | ICD-10-CM

## 2021-10-22 NOTE — Patient Instructions (Addendum)
Medication Instructions:  Your physician recommends that you continue on your current medications as directed. Please refer to the Current Medication list given to you today. *If you need a refill on your cardiac medications before your next appointment, please call your pharmacy*  Lab Work: None ordered. If you have labs (blood work) drawn today and your tests are completely normal, you will receive your results only by: MyChart Message (if you have MyChart) OR A paper copy in the mail If you have any lab test that is abnormal or we need to change your treatment, we will call you to review the results.  Testing/Procedures: None ordered.  Follow-Up: At [facility], you and your health needs are our priority.  As part of our continuing mission to provide you with exceptional heart care, we have created designated Provider Care Teams.  These Care Teams include your primary Cardiologist (physician) and Advanced Practice Providers (APPs -  Physician Assistants and Nurse Practitioners) who all work together to provide you with the care you need, when you need it.  Your next appointment:   Your physician wants you to follow-up in: 6 months at the [facility]. [phone] You will receive a reminder letter in the mail two months in advance. If you don't receive a letter, please call our office to schedule the follow-up appointment.   Important Information About Sugar

## 2021-10-22 NOTE — Progress Notes (Signed)
PCP: Joycelyn Rua, MD   Primary EP: Dr Johney Frame  [name] is a 56 y.o. male who presents today for routine electrophysiology followup.  Since last being seen in our clinic, the patient reports doing very well.  He has occasional afib events.  Today, he denies symptoms of palpitations, chest pain, shortness of breath,  lower extremity edema, dizziness, presyncope, or syncope.  The patient is otherwise without complaint today.   Past Medical History:  Diagnosis Date   Dysrhythmia    hx a fib with ablation   Gallstones    History of kidney stones    Persistent atrial fibrillation (HCC)    Sleep apnea    uses CPAP   Past Surgical History:  Procedure Laterality Date   ATRIAL FIBRILLATION ABLATION N/A 01/02/2020   Procedure: ATRIAL FIBRILLATION ABLATION;  Surgeon: Hillis Range, MD;  Location: [facility];  Service: Cardiovascular;  Laterality: N/A;   ATRIAL FIBRILLATION ABLATION N/A 03/18/2021   Procedure: ATRIAL FIBRILLATION ABLATION;  Surgeon: Hillis Range, MD;  Location: [facility];  Service: Cardiovascular;  Laterality: N/A;   CARDIOVERSION N/A 08/08/2019   Procedure: CARDIOVERSION;  Surgeon: Chilton Si, MD;  Location: [facility] ENDOSCOPY;  Service: Cardiovascular;  Laterality: N/A;   CARDIOVERSION N/A 04/18/2021   Procedure: CARDIOVERSION;  Surgeon: Wendall Stade, MD;  Location: [facility] ENDOSCOPY;  Service: Cardiovascular;  Laterality: N/A;   CHOLECYSTECTOMY N/A 05/16/2020   Procedure: LAPAROSCOPIC CHOLECYSTECTOMY WITH INTRAOPERATIVE CHOLANGIOGRAM AND UMBILICAL HERNIA REPAIR;  Surgeon: Gaynelle Adu, MD;  Location: [facility] ORS;  Service: General;  Laterality: N/A;   CYST EXCISION     removed back of leg   EXTRACORPOREAL SHOCK WAVE LITHOTRIPSY Left 06/08/2019   Procedure: EXTRACORPOREAL SHOCK WAVE LITHOTRIPSY (ESWL);  Surgeon: Noel Christmas, MD;  Location: [facility];  Service: Urology;  Laterality: Left;    ROS- all systems are reviewed and negatives  except as per HPI above  Current Outpatient Medications  Medication Sig Dispense Refill   flecainide (TAMBOCOR) 100 MG tablet TAKE 1 TABLET BY MOUTH TWICE A DAY 180 tablet 3   metoprolol succinate (TOPROL-XL) 25 MG 24 hr tablet Take 1 tablet (25 mg total) by mouth 2 (two) times daily. 60 tablet 6   prednisoLONE acetate (PRED FORTE) 1 % ophthalmic suspension Place 1 drop into the left eye daily as needed (redness and swelling).      zinc gluconate 50 MG tablet Take 50 mg by mouth 2 (two) times a week.     No current facility-administered medications for this visit.    Physical Exam: Vitals:   10/22/21 1606  BP: 124/78  Pulse: 68  SpO2: 98%  Weight: 220 lb 12.8 oz (100.2 kg)  Height: 6\' 2"  (1.88 m)    GEN- The patient is well appearing, alert and oriented x 3 today.   Head- normocephalic, atraumatic Eyes-  Sclera clear, conjunctiva pink Ears- hearing intact Oropharynx- clear Lungs- Clear to ausculation bilaterally, normal work of breathing Heart- Regular rate and rhythm, no murmurs, rubs or gallops, PMI not laterally displaced GI- soft, NT, ND, + BS Extremities- no clubbing, cyanosis, or edema  Wt Readings from Last 3 Encounters:  10/22/21 220 lb 12.8 oz (100.2 kg)  07/03/21 217 lb 3.2 oz (98.5 kg)  04/25/21 214 lb (97.1 kg)    EKG tracing ordered today is personally reviewed and shows sinus  Assessment and Plan:  Persistent afib/ atrial flutter (atypical) Doing well s/p ablation.  He continues to have occasional  AF events.  He has done remarkably well given extensive ablation required previusly Chads2vasc score is 0.  Does not require OAC. continue flecainide and toprol  2. OSA Compliant with CPAP  Risks, benefits and potential toxicities for medications prescribed and/or refilled reviewed with patient today.    Follow-up in AF clinic in 6 months  Hillis Range MD, [facility] 10/22/2021 4:25 PM

## 2022-03-09 ENCOUNTER — Other Ambulatory Visit (HOSPITAL_COMMUNITY): Payer: Self-pay | Admitting: *Deleted

## 2022-03-09 MED ORDER — METOPROLOL SUCCINATE ER 25 MG PO TB24
25.0000 mg | ORAL_TABLET | Freq: Two times a day (BID) | ORAL | 6 refills | Status: DC
Start: 2022-03-09 — End: 2022-06-09

## 2022-04-27 ENCOUNTER — Ambulatory Visit (HOSPITAL_COMMUNITY)
Admission: RE | Admit: 2022-04-27 | Discharge: 2022-04-27 | Disposition: A | Discharge status: Home / Self Care | Payer: 59 | Source: Ambulatory Visit | Attending: Physician Assistant | Admitting: Physician Assistant

## 2022-04-27 ENCOUNTER — Encounter (HOSPITAL_COMMUNITY): Payer: Self-pay | Admitting: Physician Assistant

## 2022-04-27 VITALS — BP 132/88 | HR 65 | Ht 74.0 in | Wt 222.4 lb

## 2022-04-27 DIAGNOSIS — Z79899 Other long term (current) drug therapy: Secondary | ICD-10-CM | POA: Diagnosis not present

## 2022-04-27 DIAGNOSIS — Z87442 Personal history of urinary calculi: Secondary | ICD-10-CM | POA: Diagnosis not present

## 2022-04-27 DIAGNOSIS — I491 Atrial premature depolarization: Secondary | ICD-10-CM | POA: Insufficient documentation

## 2022-04-27 DIAGNOSIS — I4819 Other persistent atrial fibrillation: Secondary | ICD-10-CM

## 2022-04-27 DIAGNOSIS — G4733 Obstructive sleep apnea (adult) (pediatric): Secondary | ICD-10-CM | POA: Insufficient documentation

## 2022-04-27 NOTE — Progress Notes (Addendum)
Primary Care Physician: Orpah Melter, MD Primary Cardiologist: none Primary Electrophysiologist: Dr Rayann Heman Referring Physician: Dr Collins Scotland is a 57 y.o. male with a history of nephrolithiasis, OSA, and atrial fibrillation who presents for follow up in the [facility]. The patient was initially diagnosed with atrial fibrillation 06/08/19 after presenting for a ESWL for kidney stones. Afib with RVR noted incidentally and he was asymptomatic. Patient is not on anticoagulation with a CHADS2VASC score of 0. He was started on metoprolol and discharged in rate controlled afib.  He denies significant alcohol use. Patient is s/p unsuccessful DCCV on 08/08/19. Patient has started using his CPAP machine. Patient presented for DCCV on 08/30/19 and was found to be in [place] after increasing flecainide. Unfortunately, he was back in rate controlled atrial flutter on follow up. Patient is s/p afib and flutter ablation with Dr Rayann Heman on 01/02/20 and again on 03/18/21. He maintained SR for about a week and then saw increases in heart rate. Patient is s/p DCCV on 04/18/21.   On follow up today, patient reports that he has infrequent episodes of elevated heart rates detected on his smart watch. They can last up to several hours. His most recent episode a few weeks ago only lasted one hour. There does not appear to be any specific trigger for his episodes.   Today, he denies symptoms of palpitations, chest pain, shortness of breath, orthopnea, PND, lower extremity edema, dizziness, presyncope, syncope, bleeding, or neurologic sequela. The patient is tolerating medications without difficulties and is otherwise without complaint today.    Atrial Fibrillation Risk Factors:  he does have symptoms or diagnosis of sleep apnea.  He is compliant with CPAP therapy. he does not have a history of rheumatic fever. he does not have a history of alcohol use. The patient does not have a  history of early familial atrial fibrillation or other arrhythmias.  he has a BMI of Body mass index is 28.55 kg/m.Marland Kitchen Filed Weights   04/27/22 0840  Weight: 100.9 kg    Family History  Problem Relation Age of Onset   Hypertension Mother      Atrial Fibrillation Management history:  Previous antiarrhythmic drugs: flecainide Previous cardioversions: 08/08/19, 04/18/21 Previous ablations: 01/02/20, 03/18/21 CHADS2VASC score: 0 Anticoagulation history: Xarelto   Past Medical History:  Diagnosis Date   Dysrhythmia    hx a fib with ablation   Gallstones    History of kidney stones    Persistent atrial fibrillation ([place])    Sleep apnea    uses CPAP   Past Surgical History:  Procedure Laterality Date   ATRIAL FIBRILLATION ABLATION N/A 01/02/2020   Procedure: ATRIAL FIBRILLATION ABLATION;  Surgeon: Thompson Grayer, MD;  Location: [facility];  Service: Cardiovascular;  Laterality: N/A;   ATRIAL FIBRILLATION ABLATION N/A 03/18/2021   Procedure: ATRIAL FIBRILLATION ABLATION;  Surgeon: Thompson Grayer, MD;  Location: [facility];  Service: Cardiovascular;  Laterality: N/A;   CARDIOVERSION N/A 08/08/2019   Procedure: CARDIOVERSION;  Surgeon: [name], MD;  Location: [facility];  Service: Cardiovascular;  Laterality: N/A;   CARDIOVERSION N/A 04/18/2021   Procedure: CARDIOVERSION;  Surgeon: Josue Hector, MD;  Location: [facility];  Service: Cardiovascular;  Laterality: N/A;   CHOLECYSTECTOMY N/A 05/16/2020   Procedure: LAPAROSCOPIC CHOLECYSTECTOMY WITH INTRAOPERATIVE CHOLANGIOGRAM AND UMBILICAL HERNIA REPAIR;  Surgeon: Greer Pickerel, MD;  Location: [facility];  Service: General;  Laterality: N/A;   CYST EXCISION  removed back of leg   EXTRACORPOREAL SHOCK WAVE LITHOTRIPSY Left 06/08/2019   Procedure: EXTRACORPOREAL SHOCK WAVE LITHOTRIPSY (ESWL);  Surgeon: [name], MD;  Location[facility];  Service: Urology;  Laterality: Left;    Current  Outpatient Medications  Medication Sig Dispense Refill   flecainide (TAMBOCOR) 100 MG tablet TAKE 1 TABLET BY MOUTH TWICE A DAY (Patient taking differently: Take 50 mg by mouth 2 (two) times daily.) 180 tablet 3   metoprolol succinate (TOPROL-XL) 25 MG 24 hr tablet Take 1 tablet (25 mg total) by mouth 2 (two) times daily. 60 tablet 6   prednisoLONE acetate (PRED FORTE) 1 % ophthalmic suspension Place 1 drop into the left eye daily as needed (redness and swelling).      valACYclovir (VALTREX) 1000 MG tablet Take by mouth.     zinc gluconate 50 MG tablet Take 50 mg by mouth 2 (two) times a week.     No current facility-administered medications for this encounter.    No Known Allergies  Social History   Socioeconomic History   Marital status: Married    Spouse name: Not on file   Number of children: Not on file   Years of education: Not on file   Highest education level: Not on file  Occupational History   Not on file  Tobacco Use   Smoking status: Never   Smokeless tobacco: Never   Tobacco comments:    Never smoke 04/27/22  Vaping Use   Vaping Use: Never used  Substance and Sexual Activity   Alcohol use: Yes    Alcohol/week: 2.0 standard drinks of alcohol    Types: 2 Cans of beer per week    Comment: occasional/social   Drug use: Never   Sexual activity: Yes  Other Topics Concern   Not on file  Social History Narrative   Lives in [place] Determinants of Health   Financial Resource Strain: Not on file  Food Insecurity: Not on file  Transportation Needs: Not on file  Physical Activity: Not on file  Stress: Not on file  Social Connections: Not on file  Intimate Partner Violence: Not on file     ROS- All systems are reviewed and negative except as per the HPI above.  Physical Exam: Vitals:   04/27/22 0840  BP: 132/88  Pulse: 65  Weight: 100.9 kg  Height: 6\' 2"  (1.88 m)    GEN- The patient is a well appearing male, alert and oriented x  3 today.   HEENT-head normocephalic, atraumatic, sclera clear, conjunctiva pink, hearing intact, trachea midline. Lungs- Clear to ausculation bilaterally, normal work of breathing Heart- Regular rate and rhythm, no murmurs, rubs or gallops  GI- soft, NT, ND, + BS Extremities- no clubbing, cyanosis, or edema MS- no significant deformity or atrophy Skin- no rash or lesion Psych- euthymic mood, full affect Neuro- strength and sensation are intact   Wt Readings from Last 3 Encounters:  04/27/22 100.9 kg  10/22/21 100.2 kg  07/03/21 98.5 kg    EKG today demonstrates  SR, PACs Vent. rate 65 BPM PR interval 170 ms QRS duration 102 ms QT/QTcB 436/453 ms  Echo 06/08/19 demonstrated  1. Left ventricular ejection fraction, by estimation, is 60 to 65%. The  left ventricle has normal function. The left ventricle has no regional  wall motion abnormalities. LV diastolic filing cannot be assessed due to underlying atrial fibrillation.   2. Right ventricular systolic function is normal.  The right ventricular  size is normal. Tricuspid regurgitation signal is inadequate for assessing PA pressure.   3. The mitral valve is normal in structure and function. No evidence of mitral valve regurgitation. No evidence of mitral stenosis.   4. The aortic valve is tricuspid. Aortic valve regurgitation is not  visualized. Mild aortic valve sclerosis is present, with no evidence of  aortic valve stenosis.   5. The inferior vena cava is dilated in size with >50% respiratory  variability, suggesting right atrial pressure of 8 mmHg.   Epic records are reviewed at length today  CHA2DS2-VASc Score = 0  The patient's score is based upon: CHF History: 0 HTN History: 0 Diabetes History: 0 Stroke History: 0 Vascular Disease History: 0 Age Score: 0 Gender Score: 0       ASSESSMENT AND PLAN: 1. Persistent Atrial Fibrillation (ICD10:  I48.19) The patient's CHA2DS2-VASc score is 0, indicating a 0.2% annual  risk of stroke.   S/p afib and typical flutter ablation with Dr Johney Frame on 01/02/20 with repeat ablation on 03/18/21.  Continue Toprol 25 mg BID  Continue flecainide 50 mg BID (patient cutting 100's in half). Patient inquires about stopping AAD today. He is still having episodes, albeit infrequently. Could trial off AAD and resume if he has more episodes. Patient will think about it.  Not currently on anticoagulation with low CV score.  [employer] mobile for home monitoring.   2. OSA Encouraged compliance with CPAP therapy. Followed by [facility] sleep medicine.     Will refer to EP in 6 months to establish care. AF clinic in one year.    [name] PA-C [facility] [address] [place], [place] [address] [phone]

## 2022-05-21 ENCOUNTER — Encounter (HOSPITAL_COMMUNITY): Payer: Self-pay | Admitting: *Deleted

## 2022-06-09 ENCOUNTER — Other Ambulatory Visit (HOSPITAL_COMMUNITY): Payer: Self-pay | Admitting: *Deleted

## 2022-06-09 MED ORDER — METOPROLOL SUCCINATE ER 25 MG PO TB24: 25.0000 mg | ORAL_TABLET | Freq: Two times a day (BID) | ORAL | 1 refills | Status: DC

## 2022-06-25 ENCOUNTER — Telehealth (HOSPITAL_COMMUNITY): Payer: Self-pay

## 2022-06-25 NOTE — Telephone Encounter (Signed)
Patient states he has had a cold off and on the last few weeks. The past 2 days he has had sinus pressure, headache and a low grade fever. He wanted to know what he can take for this cold/virus. Patient may test for Flu and Covid to make sure he is negative. Per Tilda Franco advise patient to take OTC Zyrtec, Claritin, Chlor-Tab and Mucinex is fine to take as well. He was told to not take Sudafed. Communicated with patient and he verbalized understanding.

## 2022-07-17 ENCOUNTER — Other Ambulatory Visit (HOSPITAL_COMMUNITY): Payer: Self-pay | Admitting: Physician Assistant

## 2022-08-26 IMAGING — CT CT HEART MORPH/PULM VEIN W/ CM & W/O CA SCORE
2 of 6 series · 12 of 20 positions shown, 14 images · IV contrast (Omni 300)
Comparison: None.
COMPARISON: None.

Addendum:
EXAM:
OVER-READ INTERPRETATION  CT CHEST

The following report is an over-read performed by radiologist Dr.
Tamela Choc [REDACTED] on 03/12/2021. This
over-read does not include interpretation of cardiac or coronary
anatomy or pathology. The coronary calcium score/coronary CTA
interpretation by the cardiologist is attached.
CLINICAL DATA: Atrial fibrillation scheduled for ablation.
Cardiac CTA
TECHNIQUE: A non-contrast, gated CT scan was obtained with axial slices of 3 mm
through the heart for calcium scoring. Calcium scoring was performed
using the Agatston method. A 120 kV retrospective, gated, contrast
cardiac scan was obtained. Gantry rotation speed was 250 msecs and
collimation was 0.6 mm. Nitroglycerin was not given. A delayed scan
was obtained to exclude left atrial appendage thrombus. The 3D
dataset was reconstructed in 5% intervals of the 0-95% of the R-R
cycle. Late systolic phases were analyzed on a dedicated workstation
using MPR, MIP, and VRT modes. The patient received 80 cc of
contrast.

[Series 7: 0-90% · axial · 0.41mm/px · z∈[+988,+1132]mm · 6 of 4030 slices shown, 8 images]
[im 576/4030  vessel]
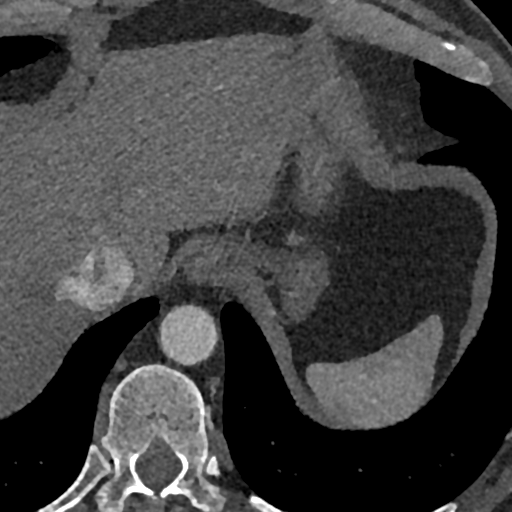
[im 576/4030  lung]
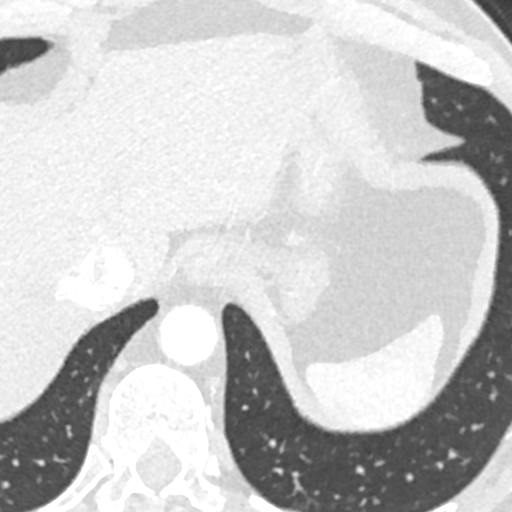
[im 1152/4030  vessel]
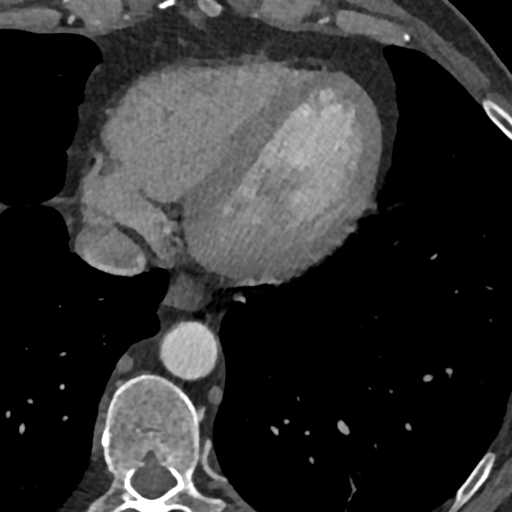
[im 1727/4030  vessel]
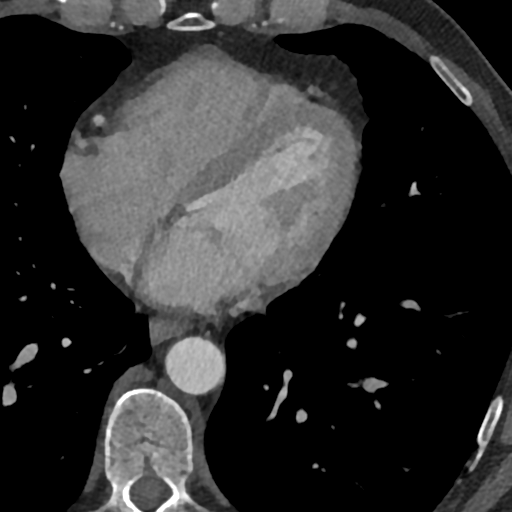
[im 2303/4030  vessel]
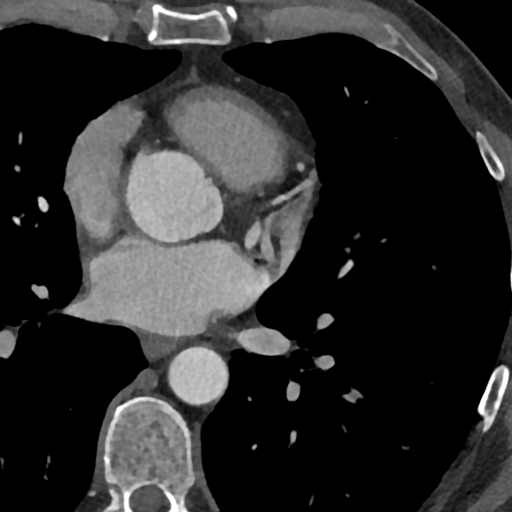
[im 2878/4030  vessel]
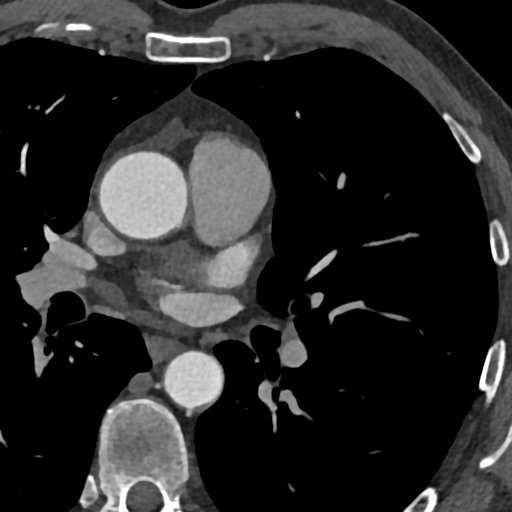
[im 2878/4030  lung]
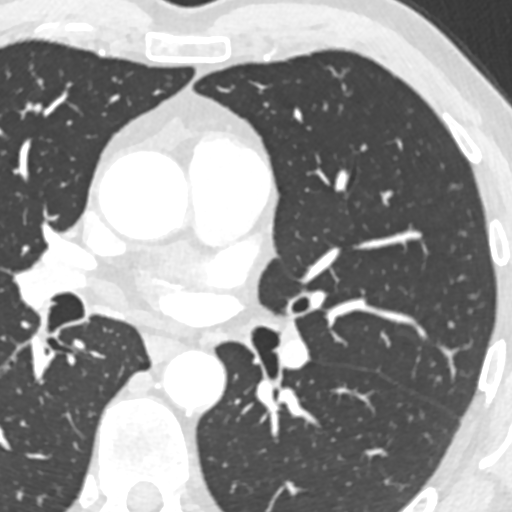
[im 3454/4030  vessel]
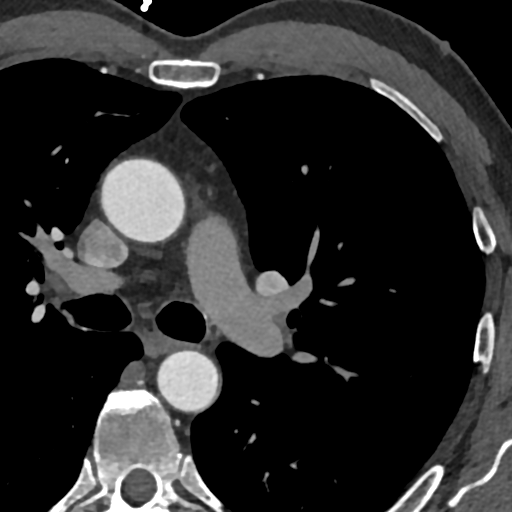

[Series 8: 5-95% · axial · 0.41mm/px · z∈[+988,+1132]mm · 6 of 4030 slices shown]
[im 576/4030  vessel]
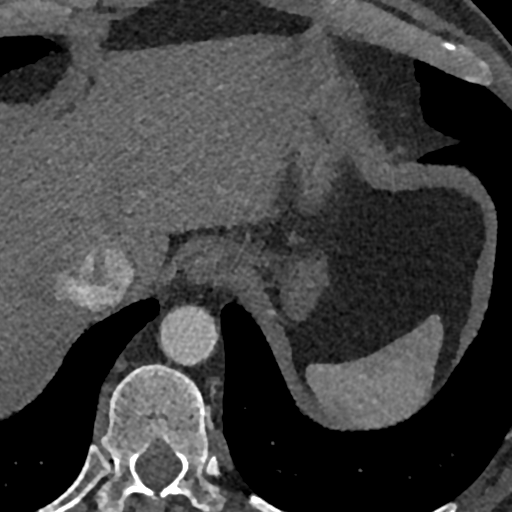
[im 1152/4030  vessel]
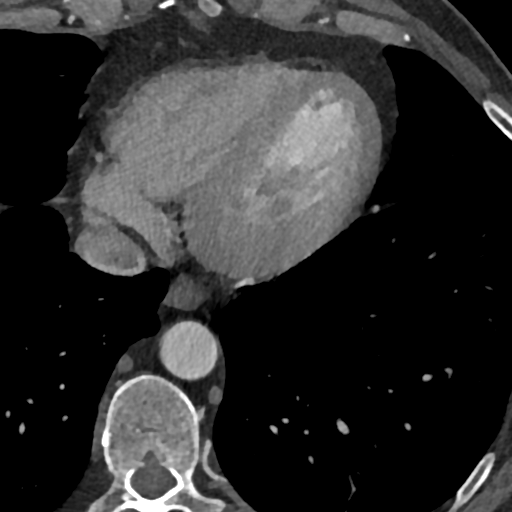
[im 1727/4030  vessel]
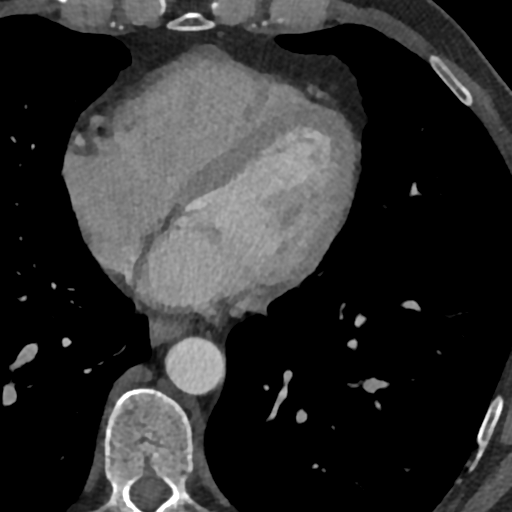
[im 2303/4030  vessel]
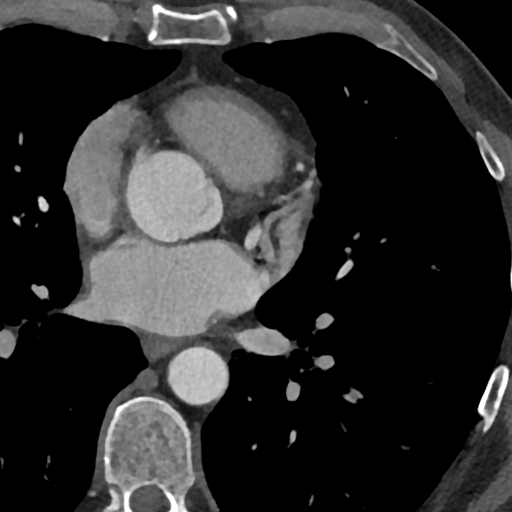
[im 2878/4030  vessel]
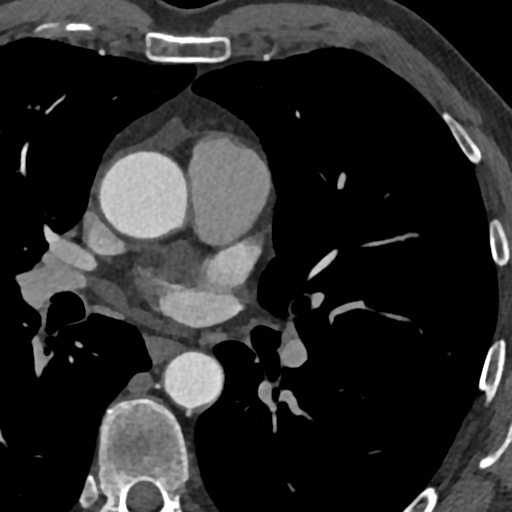
[im 3454/4030  vessel]
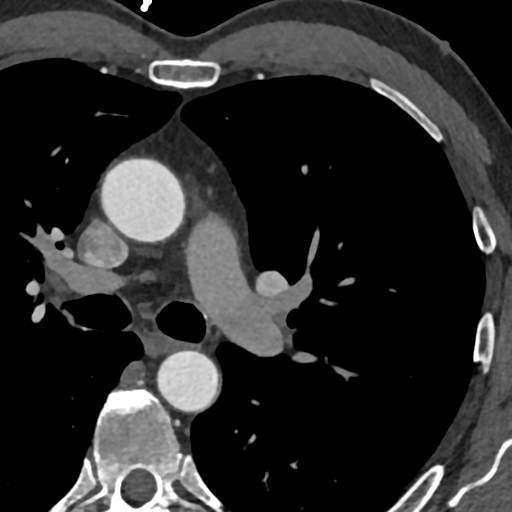

[12 of 20 positions shown; findings below may reference images not displayed]

FINDINGS: Within the visualized portions of the thorax there are no suspicious
appearing pulmonary nodules or masses, there is no acute
consolidative airspace disease, no pleural effusions, no
pneumothorax and no lymphadenopathy. Visualized portions of the
upper abdomen are unremarkable. There are no aggressive appearing
lytic or blastic lesions noted in the visualized portions of the
skeleton.
IMPRESSION: 1. No significant incidental noncardiac findings are noted.
FINDINGS: Image quality: Poor.

Pulmonary Veins: There is normal pulmonary vein drainage into the
left atrium (3 on the right and 2 on the left) with ostial
measurements as follows:

RUPV: Ostium 22 x 16 mm  area 2.71 cm^2

RMPV: Ostium 12 x 9.5 mm area 0.72 cm^2

RLPV:  Ostium 16 x 15 mm  area 1.6 cm^2

LUPV:  Ostium 20 x 16.7 mm area 2.58 cm^2

LLPV:  Ostium 19 x 16.4 mm  area 2.37 cm^2

Left Atrium: The left atrial size is mildly dilated. There is no
PFO/ASD. There is no thrombus in the left atrial appendage on
contrast or delayed imaging. The esophagus runs in the left atrial
midline and is not in proximity to any of the pulmonary vein ostia.

Coronary Arteries: CAC score of 0, which is 0 percentile for age-,
race-, and sex-matched controls. Normal coronary origin. Right
dominance. The study was performed without use of NTG and is
insufficient for plaque evaluation.

Right Atrium: Right atrial size is within normal limits.

Right Ventricle: The right ventricular cavity is within normal
limits.

Left Ventricle: The ventricular cavity size is within normal limits.
There are no stigmata of prior infarction. There is no abnormal
filling defect.

Pericardium: Normal thickness with no significant effusion or
calcium present.

Pulmonary Artery: Normal caliber without proximal filling defect.

Aorta: Ascending aorta mildly enlarged, 3.8 cm.

Extra-cardiac findings: See attached radiology report for
non-cardiac structures.
IMPRESSION: 1. There is normal pulmonary vein drainage into the left atrium with
ostial measurements above.

2. There is no thrombus in the left atrial appendage.

3. The esophagus runs in the left atrial midline and is not in
proximity to any of the pulmonary vein ostia.

4. No PFO/ASD.

5. Normal coronary origin. Right dominance.

6. CAC score of 0 which is 0 percentile for age-, race-, and
sex-matched controls.

*** End of Addendum ***
EXAM:
OVER-READ INTERPRETATION  CT CHEST

The following report is an over-read performed by radiologist Dr.
Tamela Choc [REDACTED] on 03/12/2021. This
over-read does not include interpretation of cardiac or coronary
anatomy or pathology. The coronary calcium score/coronary CTA
interpretation by the cardiologist is attached.
FINDINGS: Within the visualized portions of the thorax there are no suspicious
appearing pulmonary nodules or masses, there is no acute
consolidative airspace disease, no pleural effusions, no
pneumothorax and no lymphadenopathy. Visualized portions of the
upper abdomen are unremarkable. There are no aggressive appearing
lytic or blastic lesions noted in the visualized portions of the
skeleton.
IMPRESSION: 1. No significant incidental noncardiac findings are noted.

## 2022-09-11 ENCOUNTER — Other Ambulatory Visit (HOSPITAL_COMMUNITY): Payer: Self-pay | Admitting: *Deleted

## 2022-09-11 MED ORDER — FLECAINIDE ACETATE 50 MG PO TABS: 50.0000 mg | ORAL_TABLET | Freq: Two times a day (BID) | ORAL | 3 refills | Status: DC

## 2022-11-30 ENCOUNTER — Ambulatory Visit: Payer: 59 | Admitting: Cardiology

## 2022-12-17 ENCOUNTER — Ambulatory Visit: Payer: 59 | Attending: Cardiology | Admitting: Cardiology

## 2022-12-17 ENCOUNTER — Encounter: Payer: Self-pay | Admitting: Cardiology

## 2022-12-17 VITALS — BP 162/98 | HR 54 | Ht 74.0 in | Wt 215.4 lb

## 2022-12-17 DIAGNOSIS — I484 Atypical atrial flutter: Secondary | ICD-10-CM

## 2022-12-17 DIAGNOSIS — I4819 Other persistent atrial fibrillation: Secondary | ICD-10-CM

## 2022-12-17 DIAGNOSIS — Z79899 Other long term (current) drug therapy: Secondary | ICD-10-CM

## 2022-12-17 DIAGNOSIS — G4733 Obstructive sleep apnea (adult) (pediatric): Secondary | ICD-10-CM

## 2022-12-17 DIAGNOSIS — R9431 Abnormal electrocardiogram [ECG] [EKG]: Secondary | ICD-10-CM

## 2022-12-17 NOTE — Patient Instructions (Signed)
Medication Instructions:  Your physician recommends that you continue on your current medications as directed. Please refer to the Current Medication list given to you today.  *If you need a refill on your cardiac medications before your next appointment, please call your pharmacy*  Follow-Up: At [facility], you and your health needs are our priority.  As part of our continuing mission to provide you with exceptional heart care, we have created designated Provider Care Teams.  These Care Teams include your primary Cardiologist (physician) and Advanced Practice Providers (APPs -  Physician Assistants and Nurse Practitioners) who all work together to provide you with the care you need, when you need it.  Your next appointment:   1 year  Provider:   You may see CAMERON T LAMBERT, MD or one of the following Advanced Practice Providers on your designated Care Team:   Renee Ursuy, PA-C Michael "Andy" Tillery, PA-C Suzann Riddle, NP Brandi Ollis, NP   

## 2022-12-17 NOTE — Progress Notes (Signed)
  Electrophysiology Office Follow up Visit Note:    Date:  12/17/2022   ID:  [name], DOB Jan 24, 1966, MRN [id number]  PCP:  David Rua, MD  [facility] Cardiologist:  None  [facility] Electrophysiologist:  David Prude, MD    Interval History:    [name] is a 57 y.o. male who presents for a follow up visit.   Last seen [name] in the A-fib clinic April 27, 2022.  The patient was previously followed by Dr. Johney [name].  He has a history of atrial fibrillation.  He is not on anticoagulation given a CHA2DS2-VASc of 0.  The patient had an atrial fibrillation flutter ablation with Dr. Johney [name] on January 02, 2020 and again on March 18, 2021.  During the redo ablation, the right pulmonary veins required reisolation.  There was a left atrial flutter originating near the base the appendage that was also targeted.  A mitral line was also ablated across the roof.  The CTI was still blocked from his first ablation.  He had a cardioversion on April 18, 2021.  At the appoint with [name] he was interested in stopping his antiarrhythmic drug.  Today he tells me that he has not had any episodes of atrial fibrillation in the last 3 months.  He has been better treating his sleep apnea with CPAP during that time.  Continues on flecainide and metoprolol.  He would like to stop his flecainide if at all possible.      Past medical, surgical, social and family history were reviewed.  ROS:   Please see the history of present illness.    All other systems reviewed and are negative.  EKGs/Labs/Other Studies Reviewed:    The following studies were reviewed today:         Physical Exam:    VS:  BP (!) 162/98   Pulse (!) 54   Ht 6\' 2"  (1.88 m)   Wt 215 lb 6.4 oz (97.7 kg)   SpO2 99%   BMI 27.66 kg/m     Manual recheck of his blood pressure 138/72.  Wt Readings from Last 3 Encounters:  12/17/22 215 lb 6.4 oz (97.7 kg)  04/27/22 222 lb 6.4 oz (100.9 kg)  10/22/21 220 lb  12.8 oz (100.2 kg)     GEN:  Well nourished, well developed in no acute distress CARDIAC: RRR, no murmurs, rubs, gallops RESPIRATORY:  Clear to auscultation without rales, wheezing or rhonchi       ASSESSMENT:    1. Persistent atrial fibrillation (HCC)   2. Atypical atrial flutter (HCC)   3. Obstructive sleep apnea   4. Encounter for long-term (current) use of high-risk medication   5. Nonspecific abnormal electrocardiogram (ECG) (EKG)    PLAN:    In order of problems listed above:  #Persistent atrial fibrillation and flutter 2 prior ablations.  Both typical and atypical flutters have been noted during mapping.  Has been maintained on flecainide since the second ablation.  PR and QRS duration acceptable for ongoing flecainide use.  He will let us know if he wants to stop his flecainide.  We discussed the possibility of him going back in A-fib should he stop the flecainide.  I have asked him to continue taking metoprolol uninterrupted.  #Obstructive sleep apnea CPAP use encouraged.  Follow-up 12 months with APP.      Signed, Steffanie Dunn, MD, [facility], [facility] 12/17/2022 5:29 PM    Electrophysiology  [facility]

## 2023-01-01 ENCOUNTER — Other Ambulatory Visit (HOSPITAL_COMMUNITY): Payer: Self-pay | Admitting: Physician Assistant

## 2023-01-29 ENCOUNTER — Other Ambulatory Visit: Payer: Self-pay

## 2023-01-29 MED ORDER — METOPROLOL SUCCINATE ER 25 MG PO TB24: 25.0000 mg | ORAL_TABLET | Freq: Two times a day (BID) | ORAL | 3 refills | Status: DC

## 2023-01-29 MED ORDER — FLECAINIDE ACETATE 50 MG PO TABS: 50.0000 mg | ORAL_TABLET | Freq: Two times a day (BID) | ORAL | 3 refills | Status: DC

## 2023-05-06 ENCOUNTER — Encounter (HOSPITAL_COMMUNITY): Payer: Self-pay | Admitting: Physician Assistant

## 2023-05-06 ENCOUNTER — Ambulatory Visit (HOSPITAL_COMMUNITY)
Admission: RE | Admit: 2023-05-06 | Discharge: 2023-05-06 | Disposition: A | Discharge status: Home / Self Care | Payer: 59 | Source: Ambulatory Visit | Attending: Physician Assistant | Admitting: Physician Assistant

## 2023-05-06 VITALS — BP 122/80 | HR 56 | Ht 74.0 in | Wt 219.2 lb

## 2023-05-06 DIAGNOSIS — Z79899 Other long term (current) drug therapy: Secondary | ICD-10-CM | POA: Insufficient documentation

## 2023-05-06 DIAGNOSIS — I4892 Unspecified atrial flutter: Secondary | ICD-10-CM | POA: Insufficient documentation

## 2023-05-06 DIAGNOSIS — I4819 Other persistent atrial fibrillation: Secondary | ICD-10-CM | POA: Insufficient documentation

## 2023-05-06 DIAGNOSIS — Z87442 Personal history of urinary calculi: Secondary | ICD-10-CM | POA: Insufficient documentation

## 2023-05-06 DIAGNOSIS — Z5181 Encounter for therapeutic drug level monitoring: Secondary | ICD-10-CM | POA: Diagnosis not present

## 2023-05-06 DIAGNOSIS — G4733 Obstructive sleep apnea (adult) (pediatric): Secondary | ICD-10-CM | POA: Insufficient documentation

## 2023-05-06 DIAGNOSIS — I4891 Unspecified atrial fibrillation: Secondary | ICD-10-CM | POA: Diagnosis present

## 2023-05-06 MED ORDER — FLECAINIDE ACETATE 100 MG PO TABS: 100.0000 mg | ORAL_TABLET | Freq: Two times a day (BID) | ORAL | 3 refills | Status: DC

## 2023-05-06 NOTE — Progress Notes (Signed)
Primary Care Physician: Joycelyn Rua, MD Primary Cardiologist: none Primary Electrophysiologist: Dr Johney Frame Referring Physician: Dr Boris Sharper is a 58 y.o. male with a history of nephrolithiasis, OSA, and atrial fibrillation who presents for follow up in the [facility]. The patient was initially diagnosed with atrial fibrillation 06/08/19 after presenting for a ESWL for kidney stones. Afib with RVR noted incidentally and he was asymptomatic. Patient is not on anticoagulation with a CHADS2VASC score of 0. He was started on metoprolol and discharged in rate controlled afib.  He denies significant alcohol use. Patient is s/p unsuccessful DCCV on 08/08/19. Patient has started using his CPAP machine. Patient presented for DCCV on 08/30/19 and was found to be in SR after increasing flecainide. Unfortunately, he was back in rate controlled atrial flutter on follow up. Patient is s/p afib and flutter ablation with Dr Johney Frame on 01/02/20 and again on 03/18/21. He maintained SR for about a week and then saw increases in heart rate. Patient is s/p DCCV on 04/18/21.   Patient returns for follow up for atrial fibrillation and flecainide monitoring. He reports that over the last 2-3 months he has noted more elevated heart rate notifications on his smart watch. Theses episodes are associated with headaches. In the past month, he has had 4 episodes. The longest episodes can last 12 hours. There does not appear to be any specific trigger.   Today, he denies symptoms of chest pain, orthopnea, PND, lower extremity edema, dizziness, presyncope, syncope, snoring, daytime somnolence, bleeding, or neurologic sequela. The patient is tolerating medications without difficulties and is otherwise without complaint today.    Atrial Fibrillation Risk Factors:  he does have symptoms or diagnosis of sleep apnea.  he does not have a history of rheumatic fever. he does not have a history of  alcohol use. The patient does not have a history of early familial atrial fibrillation or other arrhythmias.   Atrial Fibrillation Management history:  Previous antiarrhythmic drugs: flecainide Previous cardioversions: 08/08/19, 04/18/21 Previous ablations: 01/02/20, 03/18/21 Anticoagulation history: Xarelto   Past Medical History:  Diagnosis Date   Dysrhythmia    hx a fib with ablation   Gallstones    History of kidney stones    Persistent atrial fibrillation (HCC)    Sleep apnea    uses CPAP    Current Outpatient Medications  Medication Sig Dispense Refill   metoprolol succinate (TOPROL-XL) 25 MG 24 hr tablet Take 1 tablet (25 mg total) by mouth 2 (two) times daily. 180 tablet 3   prednisoLONE acetate (PRED FORTE) 1 % ophthalmic suspension Place 1 drop into the left eye daily as needed (redness and swelling).      valACYclovir (VALTREX) 1000 MG tablet Take 500 mg by mouth daily.     zinc gluconate 50 MG tablet Take 50 mg by mouth 2 (two) times a week.     flecainide (TAMBOCOR) 100 MG tablet Take 1 tablet (100 mg total) by mouth 2 (two) times daily. 60 tablet 3   No current facility-administered medications for this encounter.    ROS- All systems are reviewed and negative except as per the HPI above.  Physical Exam: Vitals:   05/06/23 1021  BP: 122/80  Pulse: (!) 56  Weight: 99.4 kg  Height: 6\' 2"  (1.88 m)    GEN: Well nourished, well developed in no acute distress CARDIAC: Regular rate and rhythm, no murmurs, rubs, gallops RESPIRATORY:  Clear to auscultation without rales, wheezing  or rhonchi  ABDOMEN: Soft, non-tender, non-distended EXTREMITIES:  No edema; No deformity    Wt Readings from Last 3 Encounters:  05/06/23 99.4 kg  12/17/22 97.7 kg  04/27/22 100.9 kg    EKG today demonstrates  SB Vent. rate 56 BPM PR interval 174 ms QRS duration 104 ms QT/QTcB 450/434 ms   Echo 06/08/19 demonstrated  1. Left ventricular ejection fraction, by estimation, is 60  to 65%. The  left ventricle has normal function. The left ventricle has no regional  wall motion abnormalities. LV diastolic filing cannot be assessed due to underlying atrial fibrillation.   2. Right ventricular systolic function is normal. The right ventricular  size is normal. Tricuspid regurgitation signal is inadequate for assessing PA pressure.   3. The mitral valve is normal in structure and function. No evidence of mitral valve regurgitation. No evidence of mitral stenosis.   4. The aortic valve is tricuspid. Aortic valve regurgitation is not  visualized. Mild aortic valve sclerosis is present, with no evidence of  aortic valve stenosis.   5. The inferior vena cava is dilated in size with >50% respiratory  variability, suggesting right atrial pressure of 8 mmHg.   Epic records are reviewed at length today  CHA2DS2-VASc Score = 0  The patient's score is based upon: CHF History: 0 HTN History: 0 Diabetes History: 0 Stroke History: 0 Vascular Disease History: 0 Age Score: 0 Gender Score: 0       ASSESSMENT AND PLAN: Persistent Atrial Fibrillation/atrial flutter The patient's CHA2DS2-VASc score is 0, indicating a 0.2% annual risk of stroke.   S/p afib and CTI ablation 01/02/20. Repeat ablation 03/18/21 Patient noting more frequent episodes in the past 2-3 months. We discussed rhythm control options today. Will increase flecainide to 100 mg BID, he has tolerated this dose previously. ? If he would be a candidate for repeat ablation now that PFA is available. Will review with Dr Lalla Brothers. Continue Toprol 25 mg BID Not currently on anticoagulation with low CV score Kardia mobile/smart watch for home monitoring.   High Risk Medication Monitoring (ICD 10: Z79.899) Intervals on ECG appropriate for flecainide. Increase dose to 100 mg BID as above.  OSA  Encouraged nightly CPAP Followed by Deboraha Sprang sleep medicine   Follow up in the AF clinic in one month.    [name]  PA-C Afib Clinic [facility] [address] [place], [place] [address] [phone]

## 2023-05-06 NOTE — Patient Instructions (Signed)
Increase flecainide to 100mg twice a day 

## 2023-06-10 ENCOUNTER — Encounter (HOSPITAL_COMMUNITY): Payer: Self-pay | Admitting: Physician Assistant

## 2023-06-10 ENCOUNTER — Ambulatory Visit (HOSPITAL_COMMUNITY)
Admission: RE | Admit: 2023-06-10 | Discharge: 2023-06-10 | Disposition: A | Discharge status: Home / Self Care | Payer: 59 | Source: Ambulatory Visit | Attending: Physician Assistant | Admitting: Physician Assistant

## 2023-06-10 VITALS — BP 128/90 | HR 52 | Ht 74.0 in | Wt 216.6 lb

## 2023-06-10 DIAGNOSIS — I4819 Other persistent atrial fibrillation: Secondary | ICD-10-CM

## 2023-06-10 DIAGNOSIS — Z5181 Encounter for therapeutic drug level monitoring: Secondary | ICD-10-CM | POA: Insufficient documentation

## 2023-06-10 DIAGNOSIS — I4892 Unspecified atrial flutter: Secondary | ICD-10-CM | POA: Insufficient documentation

## 2023-06-10 DIAGNOSIS — G4733 Obstructive sleep apnea (adult) (pediatric): Secondary | ICD-10-CM | POA: Diagnosis not present

## 2023-06-10 DIAGNOSIS — Z87442 Personal history of urinary calculi: Secondary | ICD-10-CM | POA: Insufficient documentation

## 2023-06-10 DIAGNOSIS — Z79899 Other long term (current) drug therapy: Secondary | ICD-10-CM

## 2023-06-10 NOTE — Progress Notes (Signed)
 Primary Care Physician: Joycelyn Rua, MD Primary Cardiologist: none Primary Electrophysiologist: Dr Lalla Brothers Referring Physician: Dr Boris Sharper is a 58 y.o. male with a history of nephrolithiasis, OSA, and atrial fibrillation who presents for follow up in the [facility] Atrial Fibrillation [facility]. The patient was initially diagnosed with atrial fibrillation 06/08/19 after presenting for a ESWL for kidney stones. Afib with RVR noted incidentally and he was asymptomatic. Patient is not on anticoagulation with a CHADS2VASC score of 0. He was started on metoprolol and discharged in rate controlled afib.  He denies significant alcohol use. Patient is s/p unsuccessful DCCV on 08/08/19. Patient has started using his CPAP machine. Patient presented for DCCV on 08/30/19 and was found to be in SR after increasing flecainide. Unfortunately, he was back in rate controlled atrial flutter on follow up. Patient is s/p afib and flutter ablation with Dr Johney Frame on 01/02/20 and again on 03/18/21. He maintained SR for about a week and then saw increases in heart rate. Patient is s/p DCCV on 04/18/21.   Patient returns for follow up for atrial fibrillation and flecainide monitoring. Patient reports that he continued to have episodes of tachypalpitations for two weeks after increasing his dose of flecainide but in the past 1-2 weeks he has not had any further episodes. There were no specific triggers that he could identify.   Today, he denies symptoms of chest pain, shortness of breath, orthopnea, PND, lower extremity edema, dizziness, presyncope, syncope, bleeding, or neurologic sequela. The patient is tolerating medications without difficulties and is otherwise without complaint today.    Atrial Fibrillation Risk Factors:  he does have symptoms or diagnosis of sleep apnea.  he does not have a history of rheumatic fever. he does not have a history of alcohol use. The patient does not have a history of  early familial atrial fibrillation or other arrhythmias.   Atrial Fibrillation Management history:  Previous antiarrhythmic drugs: flecainide Previous cardioversions: 08/08/19, 04/18/21 Previous ablations: 01/02/20, 03/18/21 Anticoagulation history: Xarelto   Past Medical History:  Diagnosis Date   Dysrhythmia    hx a fib with ablation   Gallstones    History of kidney stones    Persistent atrial fibrillation (HCC)    Sleep apnea    uses CPAP    Current Outpatient Medications  Medication Sig Dispense Refill   flecainide (TAMBOCOR) 100 MG tablet Take 1 tablet (100 mg total) by mouth 2 (two) times daily. 60 tablet 3   metoprolol succinate (TOPROL-XL) 25 MG 24 hr tablet Take 1 tablet (25 mg total) by mouth 2 (two) times daily. 180 tablet 3   prednisoLONE acetate (PRED FORTE) 1 % ophthalmic suspension Place 1 drop into the left eye daily as needed (redness and swelling).      valACYclovir (VALTREX) 1000 MG tablet Take 500 mg by mouth daily.     zinc gluconate 50 MG tablet Take 50 mg by mouth 2 (two) times a week.     No current facility-administered medications for this encounter.    ROS- All systems are reviewed and negative except as per the HPI above.  Physical Exam: Vitals:   06/10/23 1014  BP: (!) 128/90  Pulse: (!) 52  Weight: 98.2 kg  Height: 6\' 2"  (1.88 m)     GEN: Well nourished, well developed in no acute distress CARDIAC: Regular rate and rhythm, no murmurs, rubs, gallops RESPIRATORY:  Clear to auscultation without rales, wheezing or rhonchi  ABDOMEN: Soft, non-tender, non-distended EXTREMITIES:  No edema; No deformity    Wt Readings from Last 3 Encounters:  06/10/23 98.2 kg  05/06/23 99.4 kg  12/17/22 97.7 kg    EKG today demonstrates  SB Vent. rate 52 BPM PR interval 186 ms QRS duration 106 ms QT/QTcB 476/442 ms   Echo 06/08/19 demonstrated  1. Left ventricular ejection fraction, by estimation, is 60 to 65%. The  left ventricle has normal  function. The left ventricle has no regional  wall motion abnormalities. LV diastolic filing cannot be assessed due to underlying atrial fibrillation.   2. Right ventricular systolic function is normal. The right ventricular  size is normal. Tricuspid regurgitation signal is inadequate for assessing PA pressure.   3. The mitral valve is normal in structure and function. No evidence of mitral valve regurgitation. No evidence of mitral stenosis.   4. The aortic valve is tricuspid. Aortic valve regurgitation is not  visualized. Mild aortic valve sclerosis is present, with no evidence of  aortic valve stenosis.   5. The inferior vena cava is dilated in size with >50% respiratory  variability, suggesting right atrial pressure of 8 mmHg.   Epic records are reviewed at length today  CHA2DS2-VASc Score = 0  The patient's score is based upon: CHF History: 0 HTN History: 0 Diabetes History: 0 Stroke History: 0 Vascular Disease History: 0 Age Score: 0 Gender Score: 0       ASSESSMENT AND PLAN: Persistent Atrial Fibrillation/atrial flutter The patient's CHA2DS2-VASc score is 0, indicating a 0.2% annual risk of stroke.   S/p afib and CTI ablation 01/02/20. Repeat ablation 03/18/21 Patient did have some episodes soon after increasing flecainide but seems to have abated for now. We discussed other rhythm control options including dofetilide and PFA if he fails flecainide. Information sheet on dofetilide provided today.  Continue flecainide 100 mg BID Continue Toprol 25 mg BID Not currently on anticoagulation with low CV score Kardia mobile/smart watch for home monitoring.  High Risk Medication Monitoring (ICD 10: Z79.899) Intervals on ECG acceptable for flecainide monitoring.      OSA  Encouraged nightly CPAP Followed by Deboraha Sprang sleep   Follow up with Dr Lalla Brothers in 3 months.    [name] PA-C Afib Clinic [facility] [address] [place], [place] [address] [phone]

## 2023-08-27 ENCOUNTER — Other Ambulatory Visit (HOSPITAL_COMMUNITY): Payer: Self-pay | Admitting: Physician Assistant

## 2023-09-05 ENCOUNTER — Other Ambulatory Visit (HOSPITAL_COMMUNITY): Payer: Self-pay | Admitting: Physician Assistant

## 2023-09-15 ENCOUNTER — Ambulatory Visit: Admitting: Cardiology

## 2023-09-24 ENCOUNTER — Ambulatory Visit: Admitting: Cardiology

## 2023-10-07 NOTE — Progress Notes (Signed)
  Electrophysiology Office Follow up Visit Note:    Date:  10/08/2023   ID:  [name], DOB Mar 15, 1966, MRN [id number]  PCP:  Nanci Senior, MD  [facility] Cardiologist:  None  [facility] Electrophysiologist:  OLE ONEIDA HOLTS, MD    Interval History:     [name] is a 58 y.o. male who presents for a follow up visit.   He saw a [place] in the A-fib clinic June 10, 2023.  I saw him in September 2024.  He had 2 prior catheter ablations.  He was previously on flecainide .  He was previously having some breakthrough episodes of atrial fibrillation despite treatment with flecainide .  In the past, Ricky discussed Tikosyn and ablation with the patient.  He is doing well.  He does report continued episodes of atrial fibrillation.  He tracks using his Apple Watch.  From April to May of 2025 he had nearly 100% burden of atrial fibrillation.  He feels tired when out of rhythm.  He continues to take flecainide  and metoprolol .      Past medical, surgical, social and family history were reviewed.  ROS:   Please see the history of present illness.    All other systems reviewed and are negative.  EKGs/Labs/Other Studies Reviewed:    The following studies were reviewed today:     EKG Interpretation Date/Time:  Friday October 08 2023 08:20:05 EDT Ventricular Rate:  49 PR Interval:  208 QRS Duration:  104 QT Interval:  488 QTC Calculation: 440 R Axis:   93  Text Interpretation: Sinus bradycardia Rightward axis Confirmed by HOLTS OLE [phone]) on 10/08/2023 8:41:42 AM    Physical Exam:    VS:  BP (!) 157/92   Pulse (!) 49   Ht 6' 2 (1.88 m)   Wt 210 lb (95.3 kg)   SpO2 99%   BMI 26.96 kg/m     Manual recheck 146/84  Wt Readings from Last 3 Encounters:  10/08/23 210 lb (95.3 kg)  06/10/23 216 lb 9.6 oz (98.2 kg)  05/06/23 219 lb 3.2 oz (99.4 kg)     GEN: no distress CARD: RRR, No MRG RESP: No IWOB. CTAB.      ASSESSMENT:    1. Persistent  atrial fibrillation (HCC)   2. Encounter for long-term (current) use of high-risk medication   3. Atypical atrial flutter (HCC)    PLAN:    In order of problems listed above:  #Persistent atrial fibrillation #High risk drug use-flecainide  Continued episodes of symptomatic atrial fibrillation.  His atrial arrhythmia history is complex with multiple prior catheter ablations and an atrial flutter circuit within the left atrial appendage.  We discussed treatment options today including transitioning to dofetilide or pursuing redo catheter ablation.  His PR and QRS durations are acceptable for ongoing flecainide  use.  He will let us  know if he wants to transition to dofetilide.  I did discuss the process of loading dofetilide including its risks during today's office visit.   #Elevated blood pressure reading Recheck 146/84.  I have asked him to check his blood pressures 3-4 times per week at home and bring this to his primary care appointment.  He should make a primary care appointment in the next few weeks.  Follow-up 6 months with A-fib clinic.   Signed, OLE HOLTS, MD, [facility], [facility] 10/08/2023 8:42 AM    Electrophysiology [facility]

## 2023-10-08 ENCOUNTER — Ambulatory Visit: Attending: Cardiology | Admitting: Cardiology

## 2023-10-08 ENCOUNTER — Encounter: Payer: Self-pay | Admitting: Cardiology

## 2023-10-08 VITALS — BP 157/92 | HR 49 | Ht 74.0 in | Wt 210.0 lb

## 2023-10-08 DIAGNOSIS — Z79899 Other long term (current) drug therapy: Secondary | ICD-10-CM | POA: Diagnosis not present

## 2023-10-08 DIAGNOSIS — I4819 Other persistent atrial fibrillation: Secondary | ICD-10-CM | POA: Diagnosis not present

## 2023-10-08 DIAGNOSIS — I484 Atypical atrial flutter: Secondary | ICD-10-CM

## 2023-10-08 NOTE — Patient Instructions (Addendum)
 Medication Instructions:  Your physician recommends that you continue on your current medications as directed. Please refer to the Current Medication list given to you today.  *If you need a refill on your cardiac medications before your next appointment, please call your pharmacy*  Follow-Up: At [facility], you and your health needs are our priority.  As part of our continuing mission to provide you with exceptional heart care, our providers are all part of one team.  This team includes your primary Cardiologist (physician) and Advanced Practice Providers or APPs (Physician Assistants and Nurse Practitioners) who all work together to provide you with the care you need, when you need it.  Your next appointment:   6 months  Provider:   You will see one of the following Advanced Practice Providers on your designated Care Team:   Mertha Abrahams, Kennard Pea [address] [place], PA-C Suzann Riddle, NP Creighton Doffing, NP

## 2023-12-20 ENCOUNTER — Other Ambulatory Visit (HOSPITAL_COMMUNITY): Payer: Self-pay | Admitting: Physician Assistant

## 2024-02-20 ENCOUNTER — Other Ambulatory Visit: Payer: Self-pay | Admitting: Cardiology

## 2024-04-27 ENCOUNTER — Telehealth: Payer: Self-pay | Admitting: Cardiology

## 2024-04-27 NOTE — Telephone Encounter (Signed)
" °*  STAT* If patient is at the pharmacy, call can be transferred to refill team.   1. Which medications need to be refilled? (please list name of each medication and dose if known)   flecainide  (TAMBOCOR ) 100 MG tablet   2. Would you like to learn more about the convenience, safety, & potential cost savings by using the [facility]?   3. Are you open to using the [facility] (Type [facility]. ).  4. Which pharmacy/location (including street and city if local pharmacy) is medication to be sent to?  [id number] - [place], [place] - [address]   5. Do they need a 30 day or 90 day supply?    Patient stated he will be out of this medication tomorrow.  Patient has appointment scheduled with A. Tillery on 3/16. "

## 2024-04-28 ENCOUNTER — Other Ambulatory Visit (HOSPITAL_COMMUNITY): Payer: Self-pay | Admitting: *Deleted

## 2024-04-28 MED ORDER — FLECAINIDE ACETATE 100 MG PO TABS: 100.0000 mg | ORAL_TABLET | Freq: Two times a day (BID) | ORAL | 4 refills | Status: AC

## 2024-05-03 ENCOUNTER — Other Ambulatory Visit: Payer: Self-pay

## 2024-05-03 NOTE — Telephone Encounter (Signed)
 Pt's medication was already sent to pt's pharmacy as requested. Confirmation received.

## 2024-05-17 ENCOUNTER — Ambulatory Visit (HOSPITAL_COMMUNITY)
Admission: RE | Admit: 2024-05-17 | Discharge: 2024-05-17 | Disposition: A | Source: Ambulatory Visit | Attending: Physician Assistant | Admitting: Physician Assistant

## 2024-05-17 ENCOUNTER — Encounter (HOSPITAL_COMMUNITY): Payer: Self-pay | Admitting: Physician Assistant

## 2024-05-17 VITALS — BP 138/98 | HR 56 | Ht 74.0 in | Wt 223.0 lb

## 2024-05-17 DIAGNOSIS — Z5181 Encounter for therapeutic drug level monitoring: Secondary | ICD-10-CM | POA: Diagnosis not present

## 2024-05-17 DIAGNOSIS — Z79899 Other long term (current) drug therapy: Secondary | ICD-10-CM

## 2024-05-17 DIAGNOSIS — I4819 Other persistent atrial fibrillation: Secondary | ICD-10-CM | POA: Diagnosis not present

## 2024-05-17 DIAGNOSIS — I483 Typical atrial flutter: Secondary | ICD-10-CM

## 2024-05-17 NOTE — Progress Notes (Signed)
 "   Primary Care Physician: Nanci Senior, MD Primary Cardiologist: none Primary Electrophysiologist: Dr Cindie Referring Physician: Dr Arlice Redell [name] is a 59 y.o. male with a history of nephrolithiasis, OSA, and atrial fibrillation who presents for follow up in the [facility] Atrial Fibrillation Clinic. The patient was initially diagnosed with atrial fibrillation 06/08/19 after presenting for a ESWL for kidney stones. Afib with RVR noted incidentally and he was asymptomatic. Patient is not on anticoagulation with a CHADS2VASC score of 0. He was started on metoprolol  and discharged in rate controlled afib.  He denies significant alcohol use. Patient is s/p unsuccessful DCCV on 08/08/19. Patient has started using his CPAP machine. Patient presented for DCCV on 08/30/19 and was found to be in SR after increasing flecainide . Unfortunately, he was back in rate controlled atrial flutter on follow up. Patient is s/p afib and flutter ablation with Dr Kelsie on 01/02/20 and again on 03/18/21. He maintained SR for about a week and then saw increases in heart rate. Patient is s/p DCCV on 04/18/21.   Patient returns for follow up for atrial fibrillation and flecainide  monitoring. Discussed the use of AI scribe software for clinical note transcription with the patient, who gave verbal consent to proceed.  He has not been experiencing any symptoms of atrial fibrillation recently. Previously, he had significant inconsistency with his heart rhythm, but it has stabilized over the past several months. His Apple Watch has not alerted for any afib episodes.  He feels mentally foggy occasionally and is unsure if the medication could be contributing.  He wants to incorporate more exercise into his routine, such as brisk walking and resistance training.      Today, he  denies symptoms of palpitations, chest pain, shortness of breath, orthopnea, PND, lower extremity edema, dizziness, presyncope, syncope, bleeding,  or neurologic sequela. The patient is tolerating medications without difficulties and is otherwise without complaint today.    Atrial Fibrillation Risk Factors:  he does have symptoms or diagnosis of sleep apnea.  he does not have a history of rheumatic fever. he does not have a history of alcohol use. The patient does not have a history of early familial atrial fibrillation or other arrhythmias.   Atrial Fibrillation Management history:  Previous antiarrhythmic drugs: flecainide  Previous cardioversions: 08/08/19, 04/18/21 Previous ablations: 01/02/20, 03/18/21 Anticoagulation history: Xarelto    Past Medical History:  Diagnosis Date   Dysrhythmia    hx a fib with ablation   Gallstones    History of kidney stones    Persistent atrial fibrillation (HCC)    Sleep apnea    uses CPAP    Current Outpatient Medications  Medication Sig Dispense Refill   aspirin EC 81 MG tablet Take 81 mg by mouth daily. Swallow whole.     flecainide  (TAMBOCOR ) 100 MG tablet Take 1 tablet (100 mg total) by mouth 2 (two) times daily. 60 tablet 4   metoprolol  succinate (TOPROL -XL) 25 MG 24 hr tablet TAKE 1 TABLET BY MOUTH TWICE A DAY 180 tablet 2   prednisoLONE acetate (PRED FORTE) 1 % ophthalmic suspension Place 1 drop into the left eye daily as needed (redness and swelling).      No current facility-administered medications for this encounter.    ROS- All systems are reviewed and negative except as per the HPI above.  Physical Exam: Vitals:   05/17/24 1537  BP: (!) 138/98  Pulse: (!) 56  Weight: 101.2 kg  Height: 6' 2 (1.88 m)  GEN: Well nourished, well developed in no acute distress CARDIAC: Regular rate and rhythm, no murmurs, rubs, gallops RESPIRATORY:  Clear to auscultation without rales, wheezing or rhonchi  ABDOMEN: Soft, non-tender, non-distended EXTREMITIES:  No edema; No deformity    Wt Readings from Last 3 Encounters:  05/17/24 101.2 kg  10/08/23 95.3 kg  06/10/23 98.2 kg     EKG Interpretation Date/Time:  Wednesday May 17 2024 15:39:30 EST Ventricular Rate:  56 PR Interval:  162 QRS Duration:  110 QT Interval:  460 QTC Calculation: 443 R Axis:   97  Text Interpretation: Sinus bradycardia Rightward axis Borderline ECG When compared with ECG of 08-Oct-2023 08:20, No significant change was found Confirmed by Lacey Wallman ([id number]) on 05/17/2024 4:15:00 PM    Echo 06/08/19 demonstrated  1. Left ventricular ejection fraction, by estimation, is 60 to 65%. The  left ventricle has normal function. The left ventricle has no regional  wall motion abnormalities. LV diastolic filing cannot be assessed due to underlying atrial fibrillation.   2. Right ventricular systolic function is normal. The right ventricular  size is normal. Tricuspid regurgitation signal is inadequate for assessing PA pressure.   3. The mitral valve is normal in structure and function. No evidence of mitral valve regurgitation. No evidence of mitral stenosis.   4. The aortic valve is tricuspid. Aortic valve regurgitation is not  visualized. Mild aortic valve sclerosis is present, with no evidence of  aortic valve stenosis.   5. The inferior vena cava is dilated in size with >50% respiratory  variability, suggesting right atrial pressure of 8 mmHg.   Epic records are reviewed at length today  CHA2DS2-VASc Score = 0  The patient's score is based upon: CHF History: 0 HTN History: 0 Diabetes History: 0 Stroke History: 0 Vascular Disease History: 0 Age Score: 0 Gender Score: 0       ASSESSMENT AND PLAN: Persistent Atrial Fibrillation/atrial flutter (ICD10:  I48.19) The patient's CHA2DS2-VASc score is 0, indicating a 0.2% annual risk of stroke.  S/p afib and CTI ablation 01/02/20. Repeat ablation 03/18/21. Patient appears to be maintaining SR. Continue flecainide  100 mg BID. If he continues to maintain SR, can consider decreasing to 50 mg at next visit.  Continue Toprol  25 mg BID Not  currently on anticoagulation with low CV score. Apple Watch for home monitoring.   High Risk Medication Monitoring (ICD 10: [id number]) Patient requires ongoing monitoring for anti-arrhythmic medication which has the potential to cause life threatening arrhythmias. Intervals on ECG acceptable for flecainide  monitoring.     OSA  Encouraged nightly CPAP   Follow up in the AF clinic in 6 months.    Daril Kicks PA-C Afib Clinic [facility] [address] [place], [place] [address] [phone]  "

## 2024-06-26 ENCOUNTER — Ambulatory Visit: Admitting: Student

## 2024-11-14 ENCOUNTER — Ambulatory Visit (HOSPITAL_COMMUNITY): Admitting: Physician Assistant
